# Patient Record
Sex: Female | Born: 1960 | Race: White | Hispanic: No | Smoking: Never smoker
Health system: Southern US, Community
[De-identification: ages and names within clinical notes are randomized; demographics above are authoritative.]

---

## 1998-10-02 ENCOUNTER — Other Ambulatory Visit: Admission: RE | Admit: 1998-10-02 | Discharge: 1998-10-02 | Payer: Self-pay | Admitting: *Deleted

## 2000-03-18 ENCOUNTER — Other Ambulatory Visit: Admission: RE | Admit: 2000-03-18 | Discharge: 2000-03-18 | Payer: Self-pay | Admitting: *Deleted

## 2001-03-23 ENCOUNTER — Other Ambulatory Visit: Admission: RE | Admit: 2001-03-23 | Discharge: 2001-03-23 | Payer: Self-pay | Admitting: *Deleted

## 2002-04-24 ENCOUNTER — Other Ambulatory Visit: Admission: RE | Admit: 2002-04-24 | Discharge: 2002-04-24 | Payer: Self-pay | Admitting: *Deleted

## 2004-10-09 ENCOUNTER — Ambulatory Visit: Payer: Self-pay | Admitting: Obstetrics and Gynecology

## 2005-11-19 ENCOUNTER — Ambulatory Visit: Payer: Self-pay | Admitting: Obstetrics and Gynecology

## 2007-01-05 ENCOUNTER — Ambulatory Visit: Payer: Self-pay | Admitting: Obstetrics and Gynecology

## 2007-12-27 ENCOUNTER — Emergency Department: Payer: Self-pay | Admitting: Emergency Medicine

## 2009-03-19 ENCOUNTER — Ambulatory Visit: Payer: Self-pay | Admitting: Obstetrics and Gynecology

## 2009-04-04 ENCOUNTER — Ambulatory Visit: Payer: Self-pay | Admitting: Obstetrics and Gynecology

## 2009-04-18 ENCOUNTER — Ambulatory Visit: Payer: Self-pay | Admitting: Surgery

## 2009-10-15 ENCOUNTER — Ambulatory Visit: Payer: Self-pay | Admitting: Radiation Oncology

## 2009-10-16 ENCOUNTER — Ambulatory Visit: Payer: Self-pay | Admitting: Radiation Oncology

## 2009-10-21 ENCOUNTER — Ambulatory Visit: Payer: Self-pay | Admitting: Radiation Oncology

## 2009-10-28 ENCOUNTER — Ambulatory Visit: Payer: Self-pay | Admitting: Unknown Physician Specialty

## 2009-11-15 ENCOUNTER — Ambulatory Visit: Payer: Self-pay | Admitting: Radiation Oncology

## 2009-12-16 ENCOUNTER — Ambulatory Visit: Payer: Self-pay | Admitting: Radiation Oncology

## 2009-12-20 ENCOUNTER — Ambulatory Visit: Payer: Self-pay | Admitting: Internal Medicine

## 2010-01-16 ENCOUNTER — Ambulatory Visit: Payer: Self-pay | Admitting: Radiation Oncology

## 2010-07-10 ENCOUNTER — Ambulatory Visit: Payer: Self-pay | Admitting: Radiation Oncology

## 2010-07-17 ENCOUNTER — Ambulatory Visit: Payer: Self-pay | Admitting: Radiation Oncology

## 2010-10-29 IMAGING — US ULTRASOUND RIGHT BREAST
1 series · 12 of 12 positions shown · non-contrast
Comparison: none

REASON FOR EXAM: RT BR DENSITY US
COMMENTS:

[Series 1: ultrasound right breast · 12 of 12 slices shown]
[im 1/12]
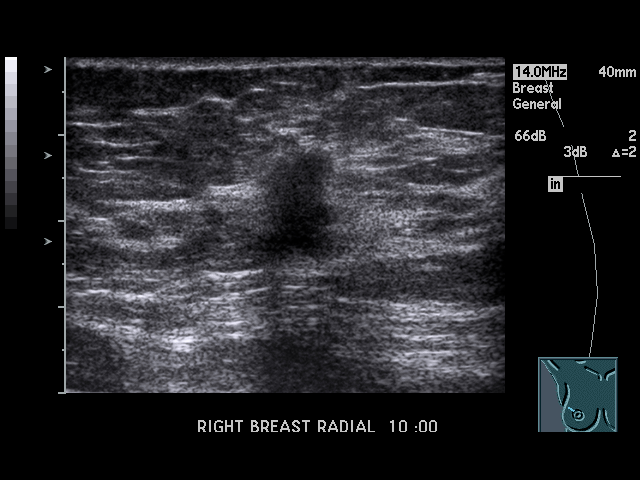
[im 2/12]
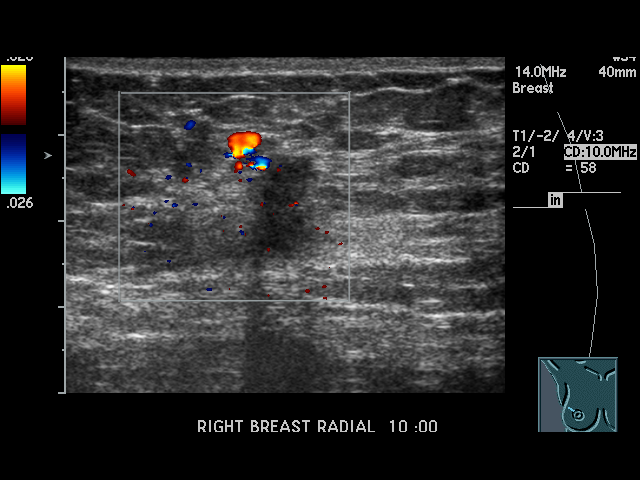
[im 3/12]
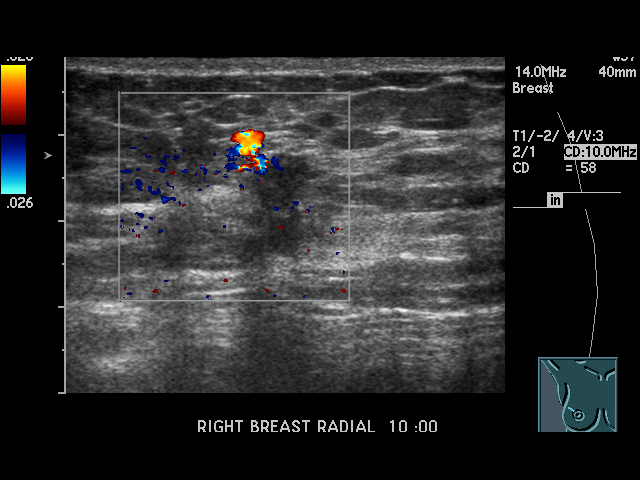
[im 4/12]
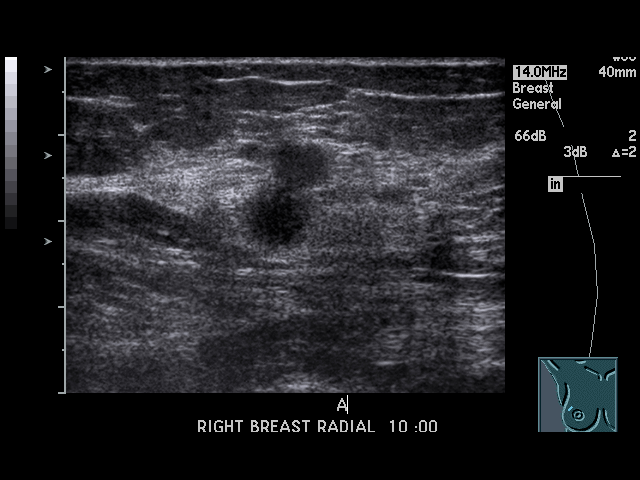
[im 5/12]
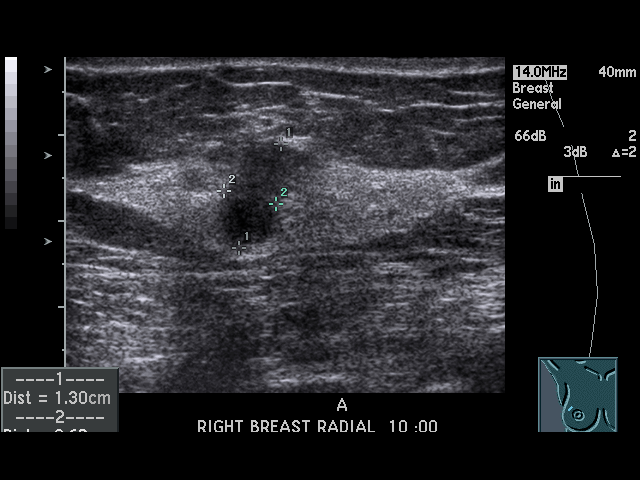
[im 6/12]
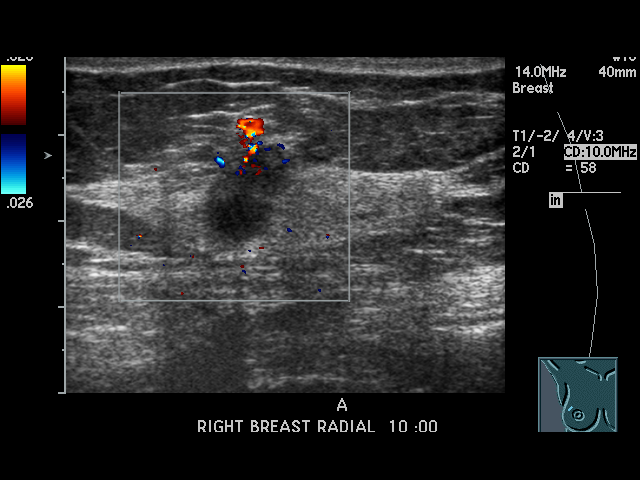
[im 7/12]
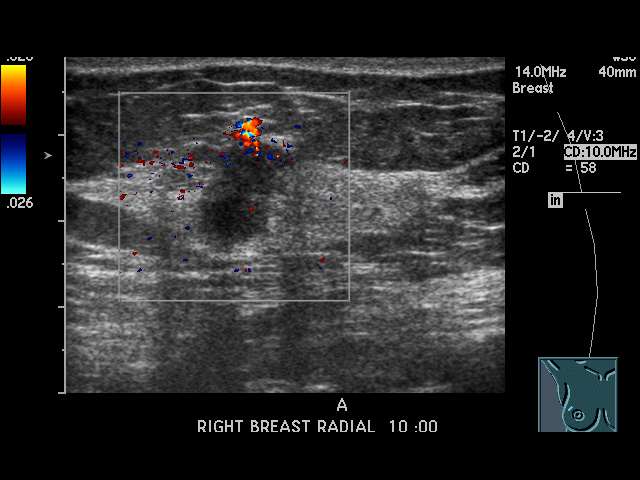
[im 8/12]
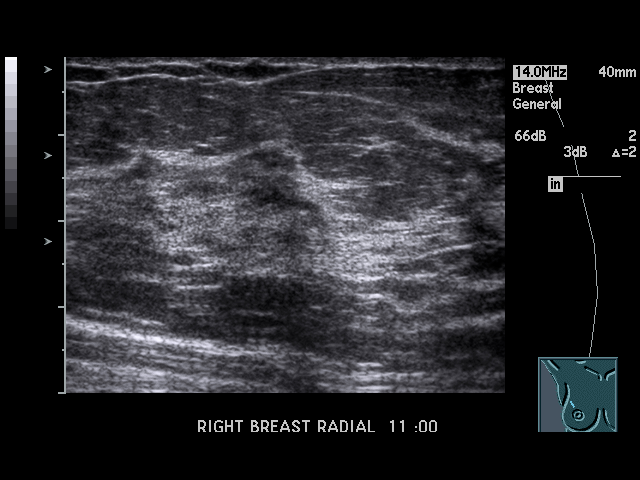
[im 9/12]
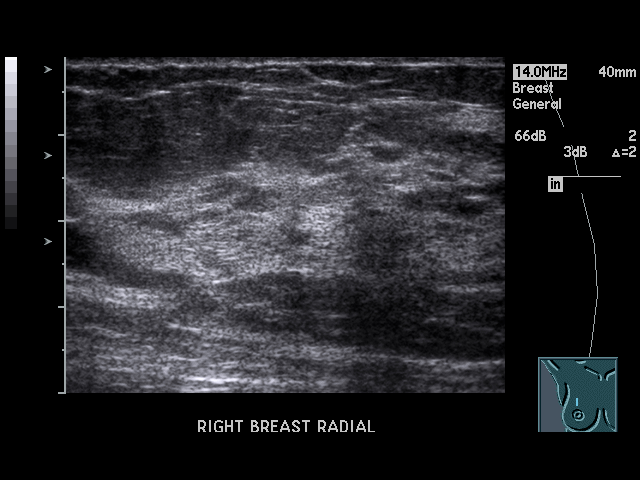
[im 10/12]
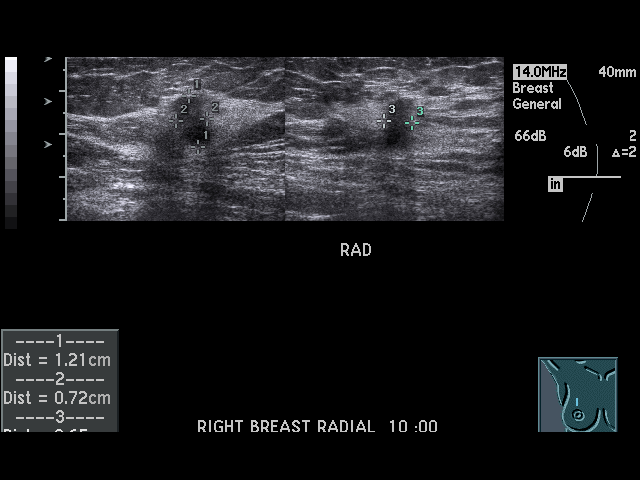
[im 11/12]
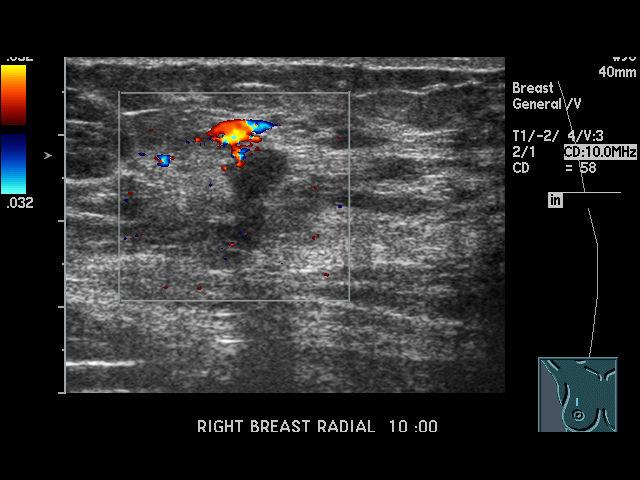
[im 12/12]
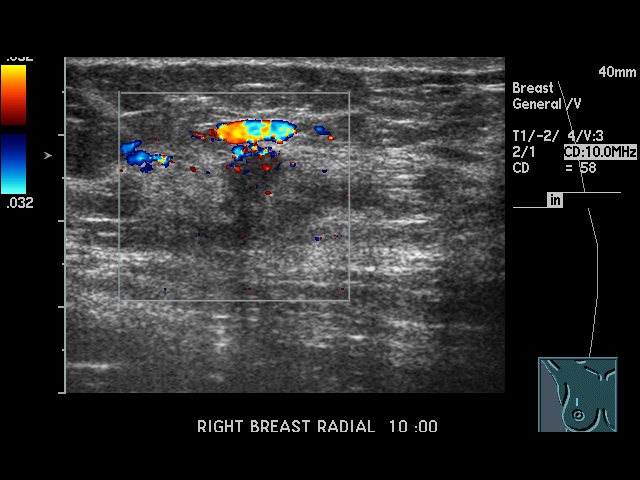

[12 of 12 positions shown; findings below may reference images not displayed]

PROCEDURE:     US  - US BREAST RIGHT  - April 04, 2009  [DATE]

RESULT:       Sonographic evaluation of the right breast demonstrates a
bilobed hypoechoic solid-appearing mass with somewhat irregular or
ill-defined margins in the upper outer posterior right breast at 10 o'clock
measuring 1.21 x 0.72 x 0.65 cm.  Surgical consultation is recommended for
consideration of biopsy.
IMPRESSION: BI-RADS:  Category 4- Suspicious Abnormality.

A negative mammogram report does not preclude biopsy or other evaluation of
a clinically palpable or otherwise suspicious mass or lesion. Breast cancer
may not be detected by mammography in up to 10% of cases.

## 2011-01-29 ENCOUNTER — Ambulatory Visit: Payer: Self-pay | Admitting: Radiation Oncology

## 2011-02-16 ENCOUNTER — Ambulatory Visit: Payer: Self-pay | Admitting: Radiation Oncology

## 2012-01-28 ENCOUNTER — Ambulatory Visit: Payer: Self-pay | Admitting: Radiation Oncology

## 2012-02-16 ENCOUNTER — Ambulatory Visit: Payer: Self-pay | Admitting: Radiation Oncology

## 2012-03-23 DIAGNOSIS — Z170 Estrogen receptor positive status [ER+]: Secondary | ICD-10-CM | POA: Insufficient documentation

## 2012-03-23 DIAGNOSIS — C50811 Malignant neoplasm of overlapping sites of right female breast: Secondary | ICD-10-CM | POA: Insufficient documentation

## 2012-06-02 ENCOUNTER — Ambulatory Visit: Payer: Self-pay | Admitting: Internal Medicine

## 2013-11-09 ENCOUNTER — Encounter: Payer: Self-pay | Admitting: Podiatrist

## 2013-11-09 ENCOUNTER — Ambulatory Visit (INDEPENDENT_AMBULATORY_CARE_PROVIDER_SITE_OTHER): Payer: BC Managed Care – PPO | Admitting: Podiatrist

## 2013-11-09 ENCOUNTER — Ambulatory Visit (INDEPENDENT_AMBULATORY_CARE_PROVIDER_SITE_OTHER): Payer: BC Managed Care – PPO

## 2013-11-09 VITALS — BP 148/77 | HR 103 | Resp 16 | Ht 63.0 in | Wt 200.0 lb

## 2013-11-09 DIAGNOSIS — M205X9 Other deformities of toe(s) (acquired), unspecified foot: Secondary | ICD-10-CM

## 2013-11-09 DIAGNOSIS — M21611 Bunion of right foot: Secondary | ICD-10-CM

## 2013-11-09 DIAGNOSIS — M722 Plantar fascial fibromatosis: Secondary | ICD-10-CM

## 2013-11-09 DIAGNOSIS — M7750 Other enthesopathy of unspecified foot: Secondary | ICD-10-CM

## 2013-11-09 DIAGNOSIS — M7751 Other enthesopathy of right foot: Secondary | ICD-10-CM

## 2013-11-09 DIAGNOSIS — M205X1 Other deformities of toe(s) (acquired), right foot: Secondary | ICD-10-CM

## 2013-11-09 DIAGNOSIS — M21619 Bunion of unspecified foot: Secondary | ICD-10-CM

## 2013-11-09 NOTE — Patient Instructions (Signed)
Plantar Fasciitis (Heel Spur Syndrome) with Rehab The plantar fascia is a fibrous, ligament-like, soft-tissue structure that spans the bottom of the foot. Plantar fasciitis is a condition that causes pain in the foot due to inflammation of the tissue. SYMPTOMS   Pain and tenderness on the underneath side of the foot.  Pain that worsens with standing or walking. CAUSES  Plantar fasciitis is caused by irritation and injury to the plantar fascia on the underneath side of the foot. Common mechanisms of injury include:  Direct trauma to bottom of the foot.  Damage to a small nerve that runs under the foot where the main fascia attaches to the heel bone. Stress placed on the plantar fascia due to any mild increased activity or injury RISK INCREASES WITH:   Obesity.  Poor strength and flexibility.  Improperly fitted shoes.  Tight calf muscles.  Flat feet.  Failure to warm-up properly before activity.  PREVENTION  Warm up and stretch properly before activity.  Strength, flexibility  Maintain a health body weight.  Avoid stress on the plantar fascia.  Wear properly fitted shoes, including arch supports for individuals who have flat feet. PROGNOSIS  If treated properly, then the symptoms of plantar fasciitis usually resolve without surgery. However, occasionally surgery is necessary. RELATED COMPLICATIONS   Recurrent symptoms that may result in a chronic condition.  Problems of the lower back that are caused by compensating for the injury, such as limping.  Pain or weakness of the foot during push-off following surgery.  Chronic inflammation, scarring, and partial or complete fascia tear, occurring more often from repeated injections. TREATMENT  Treatment initially involves the use of ice and medication to help reduce pain and inflammation. The use of strengthening and stretching exercises may help reduce pain with activity, especially stretches of the Achilles tendon.  Your  caregiver may recommend that you use arch supports to help reduce stress on the plantar fascia. Often, corticosteroid injections are given to reduce inflammation. If symptoms persist for greater than 6 months despite non-surgical (conservative), then surgery may be recommended.  MEDICATION   If pain medication is necessary, then nonsteroidal anti-inflammatory medications, such as aspirin and ibuprofen, or other minor pain relievers, such as acetaminophen, are often recommended. Corticosteroid injections may be given by your caregiver.  HEAT AND COLD  Cold treatment (icing) relieves pain and reduces inflammation. Cold treatment should be applied for 10 to 15 minutes every 2 to 3 hours for inflammation and pain and immediately after any activity that aggravates your symptoms. Use ice packs or massage the area with a piece of ice (ice massage).  Heat treatment may be used prior to performing the stretching and strengthening activities prescribed by your caregiver, physical therapist, or athletic trainer. Use a heat pack or soak the injury in warm water. SEEK IMMEDIATE MEDICAL CARE IF:  Treatment seems to offer no benefit, or the condition worsens.  Any medications produce adverse side effects.    EXERCISES-- perform each exercise a total of 10-15 repetitions.  Hold for 30 seconds and perform 3 times per day   RANGE OF MOTION (ROM) AND STRETCHING EXERCISES - Plantar Fasciitis (Heel Spur Syndrome) These exercises may help you when beginning to rehabilitate your injury.   While completing these exercises, remember:   Restoring tissue flexibility helps normal motion to return to the joints. This allows healthier, less painful movement and activity.  An effective stretch should be held for at least 30 seconds.  A stretch should never be painful. You   should only feel a gentle lengthening or release in the stretched tissue. RANGE OF MOTION - Toe Extension, Flexion  Sit with your right / left  leg crossed over your opposite knee.  Grasp your toes and gently pull them back toward the top of your foot. You should feel a stretch on the bottom of your toes and/or foot.  Hold this stretch for __________ seconds.  Now, gently pull your toes toward the bottom of your foot. You should feel a stretch on the top of your toes and or foot.  Hold this stretch for __________ seconds. Repeat __________ times. Complete this stretch __________ times per day.  RANGE OF MOTION - Ankle Dorsiflexion, Active Assisted  Remove shoes and sit on a chair that is preferably not on a carpeted surface.  Place right / left foot under knee. Extend your opposite leg for support.  Keeping your heel down, slide your right / left foot back toward the chair until you feel a stretch at your ankle or calf. If you do not feel a stretch, slide your bottom forward to the edge of the chair, while still keeping your heel down.  Hold this stretch for __________ seconds. Repeat __________ times. Complete this stretch __________ times per day.  STRETCH  Gastroc, Standing  Place hands on wall.  Extend right / left leg, keeping the front knee somewhat bent.  Slightly point your toes inward on your back foot.  Keeping your right / left heel on the floor and your knee straight, shift your weight toward the wall, not allowing your back to arch.  You should feel a gentle stretch in the right / left calf. Hold this position for __________ seconds. Repeat __________ times. Complete this stretch __________ times per day. STRETCH  Soleus, Standing  Place hands on wall.  Extend right / left leg, keeping the other knee somewhat bent.  Slightly point your toes inward on your back foot.  Keep your right / left heel on the floor, bend your back knee, and slightly shift your weight over the back leg so that you feel a gentle stretch deep in your back calf.  Hold this position for __________ seconds. Repeat __________ times.  Complete this stretch __________ times per day. STRETCH  Gastrocsoleus, Standing  Note: This exercise can place a lot of stress on your foot and ankle. Please complete this exercise only if specifically instructed by your caregiver.   Place the ball of your right / left foot on a step, keeping your other foot firmly on the same step.  Hold on to the wall or a rail for balance.  Slowly lift your other foot, allowing your body weight to press your heel down over the edge of the step.  You should feel a stretch in your right / left calf.  Hold this position for __________ seconds.  Repeat this exercise with a slight bend in your right / left knee. Repeat __________ times. Complete this stretch __________ times per day.  STRENGTHENING EXERCISES - Plantar Fasciitis (Heel Spur Syndrome)  These exercises may help you when beginning to rehabilitate your injury. They may resolve your symptoms with or without further involvement from your physician, physical therapist or athletic trainer. While completing these exercises, remember:   Muscles can gain both the endurance and the strength needed for everyday activities through controlled exercises.  Complete these exercises as instructed by your physician, physical therapist or athletic trainer. Progress the resistance and repetitions only as guided.  Hallux  Rigidus Hallux rigidus is a condition involving pain and a loss of motion of the first (big) toe. The pain gets worse with lifting up (extension) of the toe. This is usually due to arthritic bony bumps (spurring) of the joint at the base of the big toe.  SYMPTOMS   Pain, with lifting up of the toe.  Tenderness over the joint where the big toe meets the foot.  Redness, swelling, and warmth over the top of the base of the big toe (sometimes).  Foot pain, stiffness, and limping. CAUSES  Halllux rigidus is caused by arthritis of the joint where the big toe meets the foot. The arthritis creates  a bone spur that pinches the soft tissues, when the toe is extended. RISK INCREASES WITH:  Tight shoes, with a narrow toe box.  Family history of foot problems.  Gout and rheumatoid and psoriatic arthritis.  History of previous toe injury, including "turf toe."  Long first toe, flat feet, and other big toe bony bumps.  Arthritis of the big toe. PREVENTION   Wear wide toed shoes that fit well.  Tape the big toe, to reduce motion and to prevent pinching of the tissues between the bone.  Maintain physical fitness:  Foot and ankle flexibility.  Muscle strength and endurance. PROGNOSIS  This condition can usually be managed with proper treatment. However, surgery is typically required to prevent the problem from recurring.  RELATED COMPLICATIONS  Injury to other areas of the foot or ankle, caused by abnormal walking in an attempt to avoid the pain felt when walking normally. TREATMENT Treatment first involves stopping the activities that aggravate your symptoms. Ice and medicine can be used to reduce the pain and inflammation. Modifications to shoes may help reduce pain, including wearing stiff-soled shoes, shoes with a wide toe box, inserting a padded donut to relieve pressure on top of the joint, or wearing an arch support. Corticosteroid injections may be given to reduce inflammation. If non-surgical treatment is unsuccessful, surgery may be needed. Surgical options include removing the arthritic bony spur, cutting a bone in the foot to change the arc of motion (allowing the toe to extend more), or fusion of the joint (eliminating all motion in the joint at the base of the big toe).  MEDICATION   If pain medicine is needed, nonsteroidal anti-inflammatory medicines (aspirin and ibuprofen), or other minor pain relievers (acetaminophen), are often advised.  Do not take pain medicine for 7 days before surgery.  Prescription pain relievers are usually prescribed only after surgery. Use  only as directed and only as much as you need.  Ointments for arthritis, applied to the skin, may give some relief.  Injections of corticosteroids may be given to reduce inflammation. HEAT AND COLD  Cold treatment (icing) relieves pain and reduces inflammation. Cold treatment should be applied for 10 to 15 minutes every 2 to 3 hours, and immediately after activity that aggravates your symptoms. Use ice packs or an ice massage.  Heat treatment may be used before performing the stretching and strengthening activities prescribed by your caregiver, physical therapist, or athletic trainer. Use a heat pack or a warm water soak. SEEK MEDICAL CARE IF:   Symptoms get worse or do not improve in 2 weeks, despite treatment.  After surgery you develop fever, increasing pain, redness, swelling, drainage of fluids, bleeding, or increasing warmth.  New, unexplained symptoms develop. (Drugs used in treatment may produce side effects.) Document Released: 05/04/2005 Document Revised: 07/27/2011 Document Reviewed: 08/16/2008 [employer] Patient  Information 2015 [employer]. This information is not intended to replace advice given to you by your health care provider. Make sure you discuss any questions you have with your health care provider.  

## 2013-11-09 NOTE — Progress Notes (Signed)
   Subjective:    Patient ID: Nicole Romero, female    DOB: Oct 03, 1960, 53 y.o.   MRN: [id number]  HPI Comments: i have pain in the right foot for 1 year. It hurts on top and in the heel. Its gotten worse. Tennis shoes hurt. i havent done anything for my foot.  Foot Pain      Review of Systems  All other systems reviewed and are negative.      Objective:   Physical Exam Patient is awake, alert, and oriented x 3.  In no acute distress.  Vascular status is intact with palpable pedal pulses at 2/4 DP and PT bilateral and capillary refill time within normal limits. Neurological sensation is also intact bilaterally via Semmes Weinstein monofilament at 5/5 sites. Light touch, vibratory sensation, Achilles tendon reflex is intact. Dermatological exam reveals skin color, turger and texture as normal. No open lesions present.  Musculature intact with dorsiflexion, plantarflexion, inversion, eversion.  Decrease in range of motion at the first metatarsophalangeal joint right is noted. Mild pain on palpation plantar medial aspect of the right heel is noted. X-ray show hallux limitus with dorsal spurring seen at the first metatarsal head..    Assessment & Plan:  Hallux limitus, rigidus, plantar fasciitis right   Plan: Recommended an injection into the hallux limitus and the patient agreed I prepped the skin with alcohol and infiltrated dexamethasone and Marcaine mixture under sterile technique. Also recommended orthotics to help with both her problems and she was scanned at today's visit. An orthotic with a soft extension at the first metatarsal will be made for her.

## 2013-11-22 ENCOUNTER — Encounter: Payer: Self-pay | Admitting: *Deleted

## 2013-11-22 NOTE — Progress Notes (Signed)
Called pt letting her know orthotics were here. Pt made appt [phone].

## 2013-11-30 ENCOUNTER — Ambulatory Visit: Payer: Self-pay | Admitting: Podiatrist

## 2013-12-27 IMAGING — CT CT CHEST W/ CM
1 series · 15 of 33 positions shown, 19 images · IV contrast (APPLIED)
Comparison: none

REASON FOR EXAM: Call Report  [id number]  Dr Celly Beckmann patient  dyspnea
Eval  for Pulmonary E...
COMMENTS:

[Series 5: soft tissue · axial · 0.76mm/px · z∈[-328,-70]mm · 15 of 102 slices shown, 19 images]
[im 8/102  mediastinal]
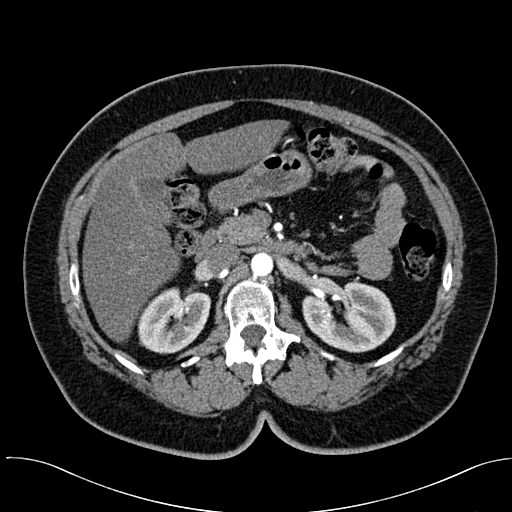
[im 8/102  lung]
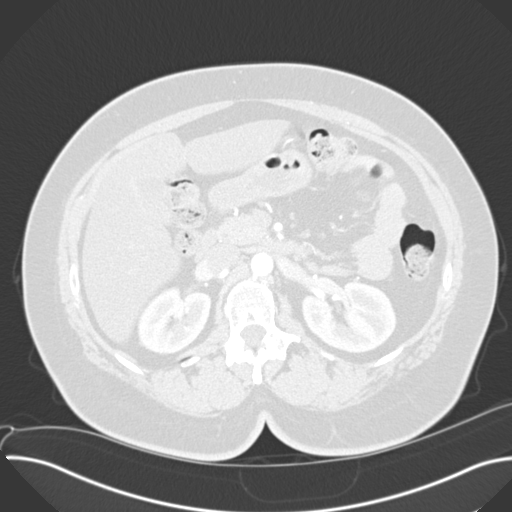
[im 15/102  lung]
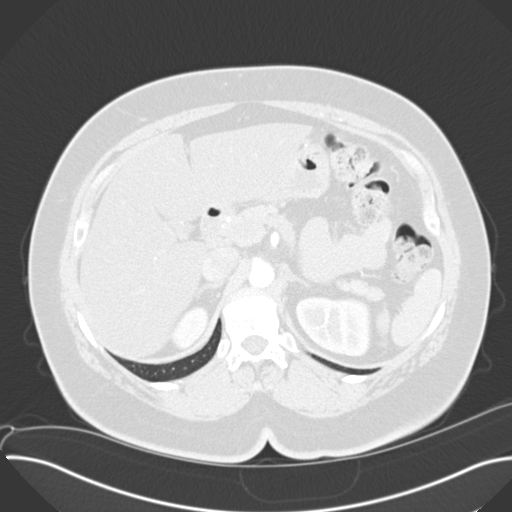
[im 21/102  lung]
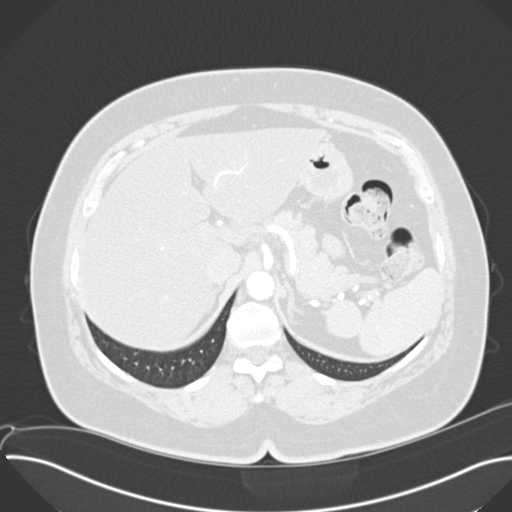
[im 27/102  lung]
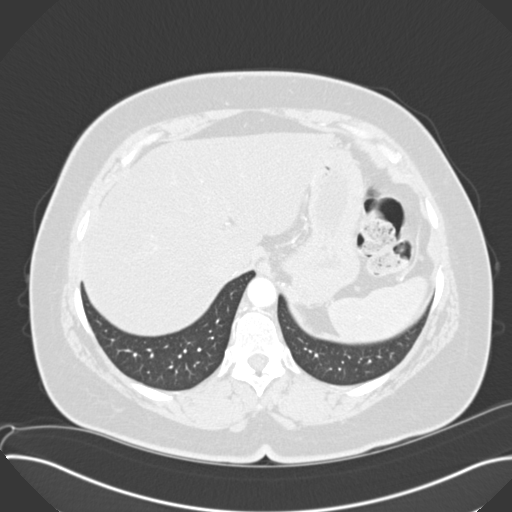
[im 34/102  mediastinal]
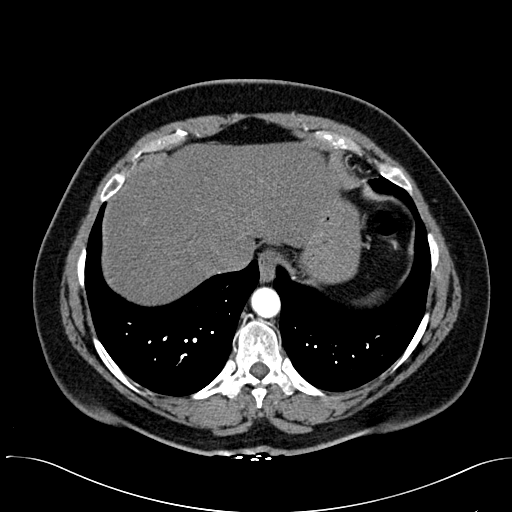
[im 34/102  lung]
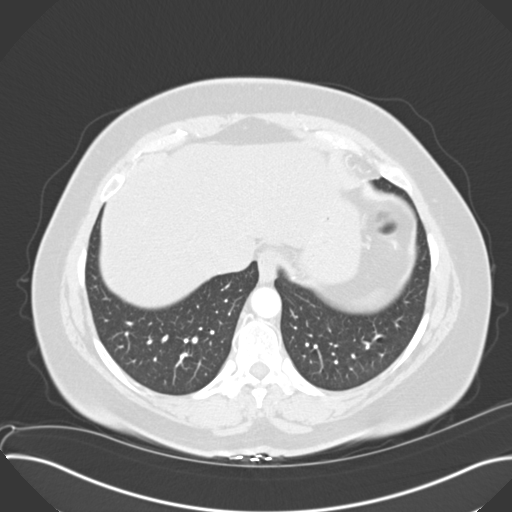
[im 41/102  lung]
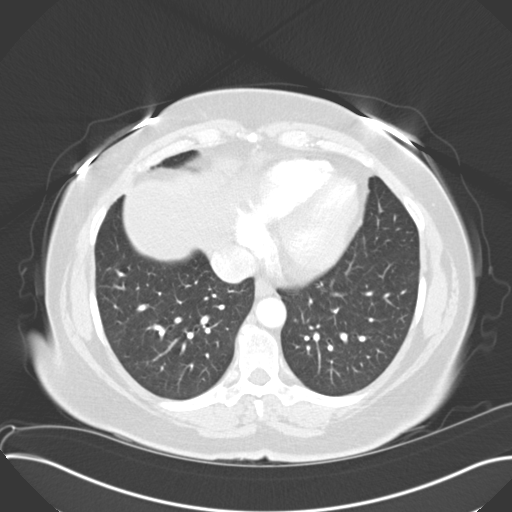
[im 45/102  lung]
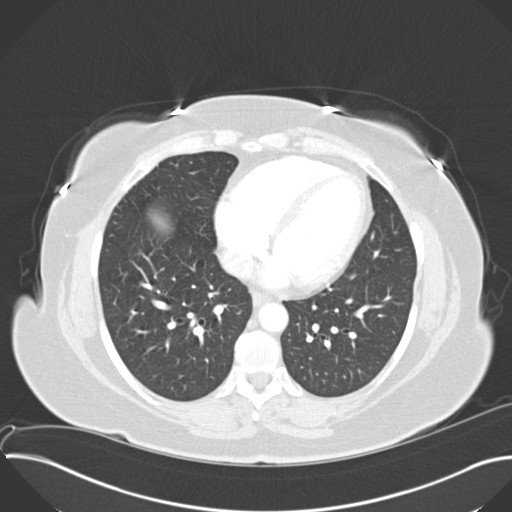
[im 53/102  lung]
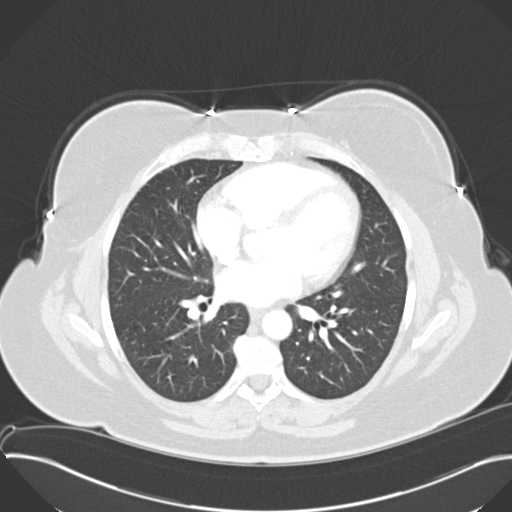
[im 57/102  mediastinal]
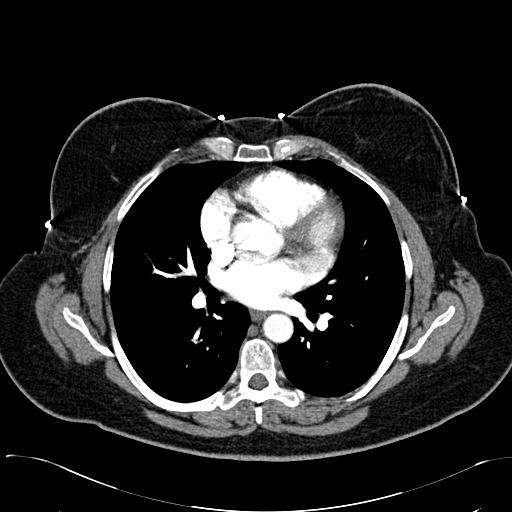
[im 57/102  lung]
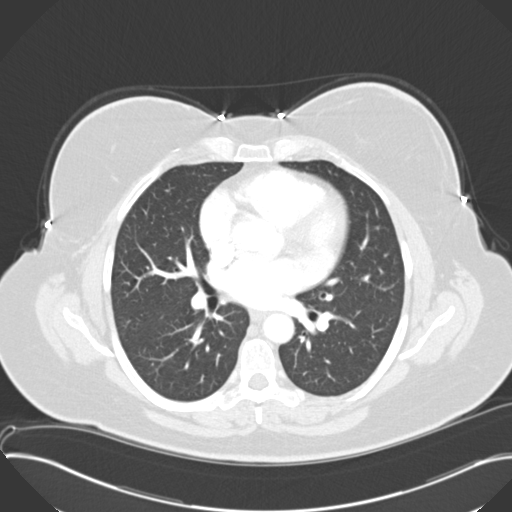
[im 61/102  lung]
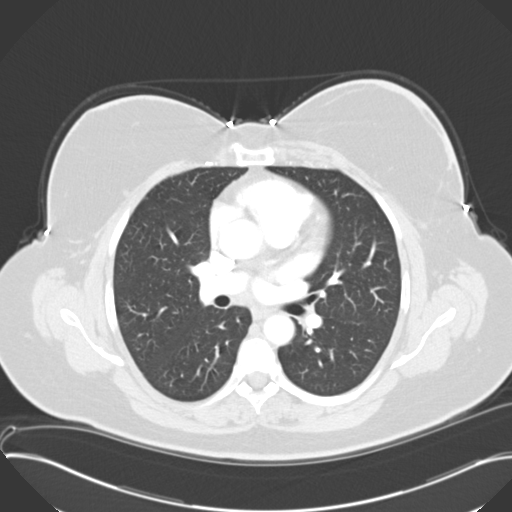
[im 68/102  lung]
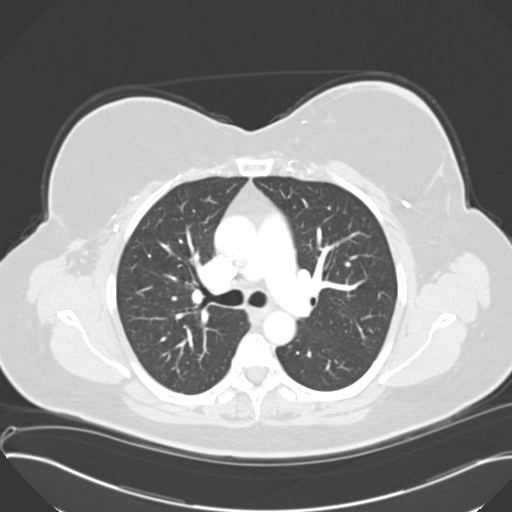
[im 75/102  lung]
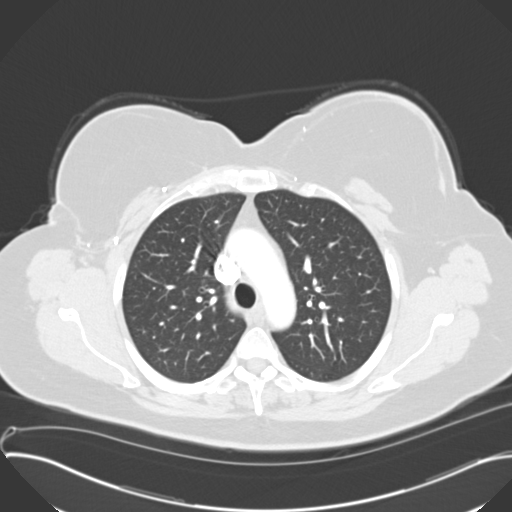
[im 81/102  mediastinal]
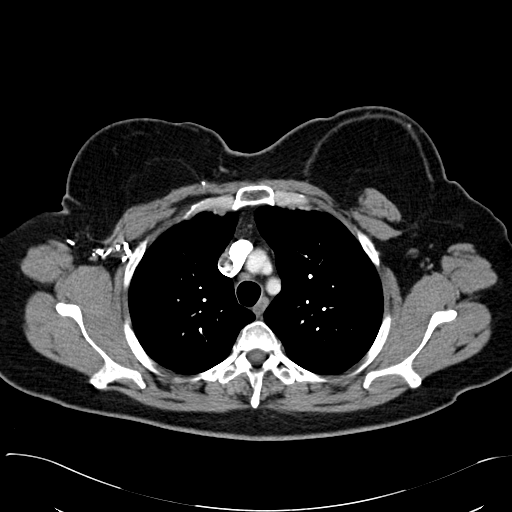
[im 81/102  lung]
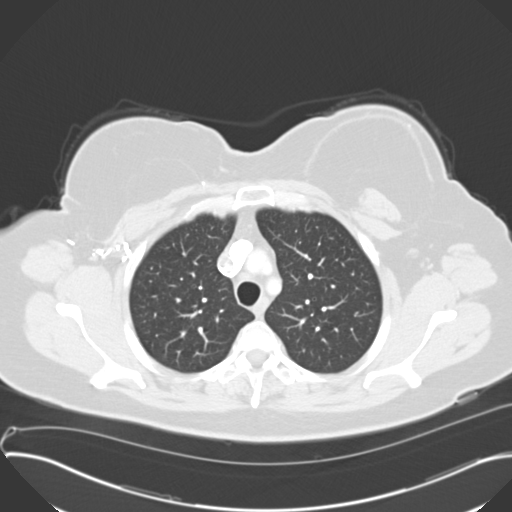
[im 87/102  lung]
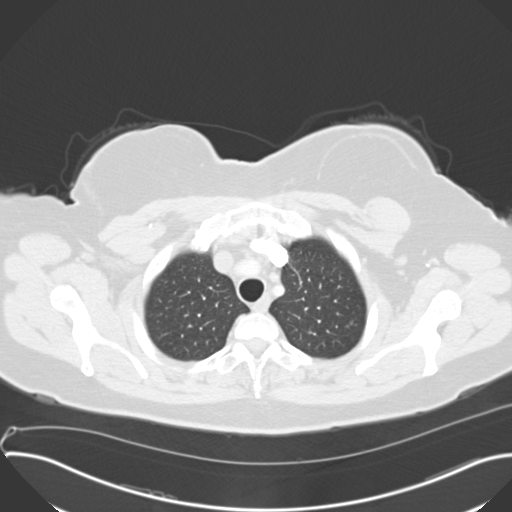
[im 94/102  lung]
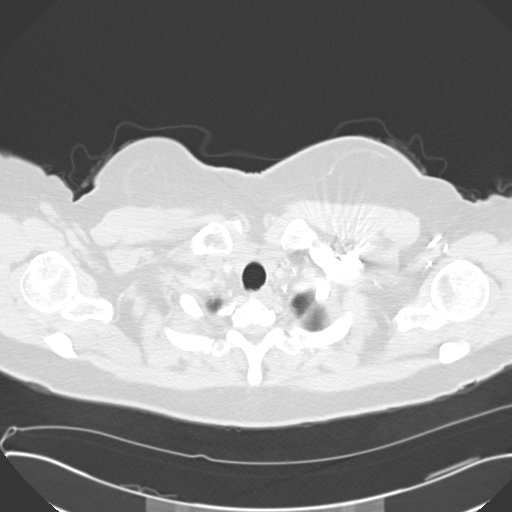

[15 of 33 positions shown; findings below may reference images not displayed]

PROCEDURE:     CT  - CT CHEST (FOR PE) W  - June 02, 2012  [DATE]

RESULT:     Axial CT scanning was performed through the chest with
reconstructions at 3 mm intervals and slice thicknesses. Review of
multiplanar reconstructed images was performed separately on the VIA
monitor. The patient received 100 cc of [id number]-[id number] intravenously.

Contrast within the pulmonary arterial tree is normal in appearance. There
are no filling defects to suggest an acute pulmonary embolism. The cardiac
chambers are top normal in size. The caliber of the thoracic aorta is normal
and there is no evidence of a false lumen. The thoracic esophagus is normal
in caliber. There is no pleural nor pericardial effusion. There are no
pathologic sized mediastinal or hilar lymph nodes. There is no pleural nor
pericardial effusion.

At lung window settings there is no interstitial nor alveolar infiltrate.
There are no pulmonary parenchymal masses. Within the upper abdomen there is
decreased density in the liver consistent with fatty infiltration. There are
no adrenal masses. The observed portions of the gallbladder appear normal.
There is no splenomegaly. The stomach is only partially distended.
IMPRESSION: 1. There is no evidence of an acute pulmonary embolism.
2. There is no evidence of acute thoracic aortic pathology nor evidence of
CHF.
3. There is no evidence of pneumonia. There is no pneumothorax.

A preliminary report was called to Dr. Alahassan at approximately [DATE] p.m. on
02 June, 2012.

[REDACTED]

## 2014-01-25 DIAGNOSIS — Z1379 Encounter for other screening for genetic and chromosomal anomalies: Secondary | ICD-10-CM | POA: Insufficient documentation

## 2014-03-01 ENCOUNTER — Ambulatory Visit: Payer: Self-pay | Admitting: Obstetrics & Gynecology

## 2014-03-01 LAB — CBC
HCT: 39 % (ref 35.0–47.0)
HGB: 12.5 g/dL (ref 12.0–16.0)
MCH: 29.9 pg (ref 26.0–34.0)
MCHC: 32 g/dL (ref 32.0–36.0)
MCV: 93 fL (ref 80–100)
Platelet: 238 10*3/uL (ref 150–440)
RBC: 4.18 10*6/uL (ref 3.80–5.20)
RDW: 12.7 % (ref 11.5–14.5)
WBC: 6.8 10*3/uL (ref 3.6–11.0)

## 2014-03-01 LAB — APTT: Activated PTT: 28.1 secs (ref 23.6–35.9)

## 2014-03-01 LAB — PROTIME-INR
INR: 0.9
Prothrombin Time: 12.3 secs (ref 11.5–14.7)

## 2014-03-08 ENCOUNTER — Ambulatory Visit: Payer: Self-pay | Admitting: Obstetrics & Gynecology

## 2014-06-21 DIAGNOSIS — K2100 Gastro-esophageal reflux disease with esophagitis, without bleeding: Secondary | ICD-10-CM | POA: Insufficient documentation

## 2014-06-21 DIAGNOSIS — G2581 Restless legs syndrome: Secondary | ICD-10-CM | POA: Insufficient documentation

## 2014-07-20 ENCOUNTER — Ambulatory Visit: Payer: Self-pay | Admitting: Unknown Physician Specialty

## 2014-09-08 NOTE — Op Note (Signed)
PATIENT NAME:  Nicole Romero, Nicole Romero MR#:  [id number] DATE OF BIRTH:  1960-07-14  DATE OF PROCEDURE:  03/08/2014  PREOPERATIVE DIAGNOSIS: Breast cancer and risk for recurrence of breast cancer and ovarian cancer.   POSTOPERATIVE DIAGNOSIS: Breast cancer and risk for recurrence of breast cancer and ovarian cancer.   PROCEDURE: Operative laparoscopy with prophylactic bilateral salpingo-oophorectomy.   SURGEON: Glean Salen, MD   ANESTHESIA: General.   ESTIMATED BLOOD LOSS: Minimal.   COMPLICATIONS: None.   FINDINGS: Normal tubes, ovaries, and uterus visualized. Minimal adhesions.   DISPOSITION: To the recovery room in stable condition.   TECHNIQUE: The patient is prepped and draped in the usual sterile fashion after adequate anesthesia is obtained in the dorsal lithotomy position. A Foley catheter is inserted. A Hulka tenaculum is placed on the cervix for manipulation purposes.   Attention is then turned to the abdomen where a Veress needle is inserted through a 5 mm incision in the left upper quadrant at [place] point. Placement of the needle was confirmed using the hanging drop technique and the abdomen is then insufflated with CO2 gas. A 5 mm trocar is then inserted under direct visualization with the laparoscope with no injuries or bleeding noted. The patient is placed in Trendelenburg position and the above-mentioned findings are visualized.   An 11 mm trocar is placed in the right lower quadrant and a 5 mm trocar is placed in the suprapubic region under direct visualization with the laparoscope with no injuries or bleeding noted. Using the 5 mm Harmonic scalpel, the left adnexa is identified and coagulated and cut for full dissection free across the uterine ovarian blood vessels and the ovarian blood vessels. Once it is completely amputated, it is placed in the posterior cul-de-sac for later retrieval. Hemostasis is assured using electrocautery. The right fallopian tube and its ovary are also  grasped and carefully coagulated and dissected free using the Harmonic scalpel with careful coagulation of the pedicles involving the ovarian artery as well as the uterine ovarian blood vessels, and once it is completely dissected free it is placed in the posterior cul-de-sac as well. Examination of both operative sites reveals excellent hemostasis. An Endopouch is placed and both vessels are placed into the Endopouch and removed from the abdominal cavity. The pelvic cavity is irrigated with copious amounts of fluid with aspiration of all fluid and excellent hemostasis noted. There is no apparent injury to bowel, bladder, ureter or other structures. The patient is leveled. Gas is expelled. Trocars are removed. Skin is closed with Dermabond as well as a 4-0 Vicryl suture in the right lower quadrant skin incision. Hulka tenaculum is removed and patient goes to the recovery room in stable condition. All sponge, instrument, and needle counts are correct at the conclusion of the case.    ____________________________ R. Barnett Applebaum, MD rph:ST D: 03/08/2014 12:46:16 ET T: 03/09/2014 01:00:57 ET JOB#: [id number]  cc: Glean Salen, MD, <Dictator> Gae Dry MD ELECTRONICALLY SIGNED 03/09/2014 7:24

## 2014-09-10 LAB — SURGICAL PATHOLOGY

## 2017-02-24 DIAGNOSIS — R7989 Other specified abnormal findings of blood chemistry: Secondary | ICD-10-CM | POA: Insufficient documentation

## 2018-05-20 ENCOUNTER — Ambulatory Visit (INDEPENDENT_AMBULATORY_CARE_PROVIDER_SITE_OTHER): Payer: BC Managed Care – PPO | Admitting: Podiatry

## 2018-05-20 ENCOUNTER — Ambulatory Visit (INDEPENDENT_AMBULATORY_CARE_PROVIDER_SITE_OTHER): Payer: BC Managed Care – PPO

## 2018-05-20 ENCOUNTER — Encounter: Payer: Self-pay | Admitting: Podiatry

## 2018-05-20 VITALS — BP 156/73 | HR 67 | Temp 97.8°F

## 2018-05-20 DIAGNOSIS — M205X1 Other deformities of toe(s) (acquired), right foot: Secondary | ICD-10-CM

## 2018-05-20 NOTE — Progress Notes (Signed)
   HPI: 58 year old female presenting today with a chief complaint of intermittent throbbing, aching and burning pain of the right hallux that began a few years ago. Bending the toe and wearing certain shoes increases the pain. She has been taking Tylenol and Ibuprofen with no significant relief. Patient is here for further evaluation and treatment.   No past medical history on file.   Physical Exam: General: The patient is alert and oriented x3 in no acute distress.  Dermatology: Skin is warm, dry and supple bilateral lower extremities. Negative for open lesions or macerations.  Vascular: Palpable pedal pulses bilaterally. No edema or erythema noted. Capillary refill within normal limits.  Neurological: Epicritic and protective threshold grossly intact bilaterally.   Musculoskeletal Exam: Pain on palpation with limited range of motion noted to the first MPJ right foot.  Radiographic Exam: Degenerative changes noted with joint space narrowing first MPJ. There also appears to be extra-articular spurring noted about the joint.   Assessment: 1. 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 Patient evaluated. X-Rays reviewed.  2. Injection of 0.5 mLs Celestone Soluspan injected into the 1st MPJ of the right foot.  3. Recommended good shoe gear.  4. Continue taking OTC Tylenol as needed.  5. Patient thinking about surgery in the late summer after her trip to [place].  6. Return to clinic as needed.   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       Edrick Kins, DPM [facility]  Dr. Edrick Kins, DPM    [address], [place] [address]                Office [phone]  Fax [phone]

## 2018-11-08 ENCOUNTER — Encounter: Payer: Self-pay | Admitting: Podiatry

## 2018-11-08 ENCOUNTER — Ambulatory Visit: Payer: BC Managed Care – PPO | Admitting: Podiatry

## 2018-11-08 ENCOUNTER — Other Ambulatory Visit: Payer: Self-pay

## 2018-11-08 VITALS — Temp 97.5°F

## 2018-11-08 DIAGNOSIS — M205X1 Other deformities of toe(s) (acquired), right foot: Secondary | ICD-10-CM | POA: Diagnosis not present

## 2018-11-08 MED ORDER — MELOXICAM 15 MG PO TABS: 15.0000 mg | ORAL_TABLET | Freq: Every day | ORAL | 1 refills | Status: DC

## 2018-11-10 NOTE — Progress Notes (Signed)
   HPI: 58 year old female presenting today for follow up evaluation of right great toe pain. She states she is starting to experience pain in the 1st MPJ again. The last injection she received at her last visit provided relief so she is requesting another. She has been taking OTC Tylenol for treatment as well. Trying to move the toe and walking increases the pain. Patient is here for further evaluation and treatment.   History reviewed. No pertinent past medical history.   Physical Exam: General: The patient is alert and oriented x3 in no acute distress.  Dermatology: Skin is warm, dry and supple bilateral lower extremities. Negative for open lesions or macerations.  Vascular: Palpable pedal pulses bilaterally. No edema or erythema noted. Capillary refill within normal limits.  Neurological: Epicritic and protective threshold grossly intact bilaterally.   Musculoskeletal Exam: Pain on palpation with limited range of motion noted to the first MPJ right foot.  Assessment: 1. 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 Patient evaluated.   2. Injection of 0.5 mLs Celestone Soluspan injected into the 1st MPJ of the right foot.  3. Prescription for Meloxicam provided to patient. 4. Recommended stiff sole shoes.  5. Return to clinic as needed.   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       Edrick Kins, DPM [facility]  Dr. Edrick Kins, DPM    [address], [place] [address]                Office [phone]  Fax [phone]

## 2019-03-04 ENCOUNTER — Other Ambulatory Visit: Payer: Self-pay | Admitting: Podiatry

## 2019-04-18 ENCOUNTER — Other Ambulatory Visit: Payer: Self-pay | Admitting: Podiatry

## 2019-04-18 ENCOUNTER — Ambulatory Visit: Payer: BC Managed Care – PPO | Admitting: Podiatry

## 2019-04-18 ENCOUNTER — Other Ambulatory Visit: Payer: Self-pay

## 2019-04-18 DIAGNOSIS — M2021 Hallux rigidus, right foot: Secondary | ICD-10-CM | POA: Diagnosis not present

## 2019-04-18 DIAGNOSIS — M79671 Pain in right foot: Secondary | ICD-10-CM

## 2019-04-18 DIAGNOSIS — M19071 Primary osteoarthritis, right ankle and foot: Secondary | ICD-10-CM

## 2019-04-20 ENCOUNTER — Encounter: Payer: Self-pay | Admitting: Podiatry

## 2019-04-20 NOTE — Progress Notes (Signed)
Subjective:  Patient ID: Nicole Romero, female    DOB: [birth year],  MRN: [id number]  Chief Complaint  Patient presents with  . hallux limitus    Rt hallux joint flare up pain x 3 mo; 6/10 sharp pains  -worse with bending or cold wearther Tx; meloxicam -pt states," pain has been off the hook soemtimes, been up on my feet a lot."     58 y.o. female presents with the above complaint.  Patient presents with the right hallux first MPJ arthritis.  She states that the joint has flared up again.  It has been going on for 3 months now starting to get worse.  She states is worse when bending the toe or cold weather.  She has tried meloxicam which has not helped patient states that pain has been off the hook sometimes especially if she has been on her feet for a while.  She states the pain is 6 out of 10.  She states that she would like another injection as that gives her a pretty good amount of pain relief.  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   Review of Systems: Negative except as noted in the HPI. Denies N/V/F/Ch.  No past medical history on file.  Current Outpatient Medications:  .  Cholecalciferol (VITAMIN D3) 125 MCG (5000 UT) TABS, Take by mouth., Disp: , Rfl:  .  Ivermectin 1 % CREA, Apply topically., Disp: , Rfl:  .  magnesium oxide (MAG-OX) 400 MG tablet, Take by mouth., Disp: , Rfl:  .  meloxicam (MOBIC) 15 MG tablet, TAKE 1 TABLET(15 MG) BY MOUTH DAILY, Disp: 60 tablet, Rfl: 1 .  omeprazole (PRILOSEC) 20 MG capsule, Take by mouth., Disp: , Rfl:  .  pramipexole (MIRAPEX) 0.5 MG tablet, Take by mouth., Disp: , Rfl:  .  tamoxifen (NOLVADEX) 20 MG tablet, TAKE 1 TABLET BY MOUTH EVERY DAY, Disp: , Rfl:   Social History   Tobacco Use  Smoking Status Never Smoker  Smokeless Tobacco Never Used    Allergies  Allergen Reactions  . Other Itching   Objective:  There were no vitals filed for this visit. There is no height or weight on  file to calculate BMI. Constitutional Well developed. Well nourished.  Vascular Dorsalis pedis pulses palpable bilaterally. Posterior tibial pulses palpable bilaterally. Capillary refill normal to all digits.  No cyanosis or clubbing noted. Pedal hair growth normal.  Neurologic Normal speech. Oriented to person, place, and time. Epicritic sensation to light touch grossly present bilaterally.  Dermatologic Nails well groomed and normal in appearance. No open wounds. No skin lesions.  Orthopedic:  Pain on palpation to the right first metatarsophalangeal joint.  Pain with range of motion of the first metatarsophalangeal joint.  Limited range of motion active and passive of the first metatarsophalangeal joint.  There is crepitus PET present.  Less than 10 degrees of dorsiflexion present.  No pain at the IPJ of the right hallux.   Radiographs: None Assessment:  No diagnosis found. Plan:  Patient was evaluated and treated and all questions answered.  Right first metatarsal phalangeal joint no joint disease/hallux rigidus -I explained to the patient the etiology and various treatment options associated for hallux rigidus/DJD.  I explained to her that given the amount of arthritis in the future she may need fusion versus arthroplasty of the left first metatarsophalangeal joint.  She states that she understands that but for now she would like to  try the injections especially because it is giving her relief.  I agree with the patient I believe since the injections are helpful she will benefit from steroid injection. -A steroid injection was performed at right first metatarsophalangeal joint using 1% plain Lidocaine and 10 mg of Kenalog. This was well tolerated.   No follow-ups on file.

## 2019-07-02 ENCOUNTER — Other Ambulatory Visit: Payer: Self-pay | Admitting: Podiatry

## 2019-07-03 NOTE — Telephone Encounter (Signed)
Refill for Meloxicam sent [facility] in [place]

## 2019-07-15 ENCOUNTER — Ambulatory Visit: Payer: BC Managed Care – PPO | Attending: Internal Medicine

## 2019-07-15 DIAGNOSIS — Z23 Encounter for immunization: Secondary | ICD-10-CM | POA: Insufficient documentation

## 2019-07-15 NOTE — Progress Notes (Signed)
   Covid-19 Vaccination Clinic  Name:  Nicole Romero    MRN: [id number] DOB: 03-28-1961  07/15/2019  [name] was observed post Covid-19 immunization for 15 minutes without incidence. She was provided with Vaccine Information Sheet and instruction to access the V-Safe system.   Ms. Writer was instructed to call 911 with any severe reactions post vaccine: Marland Kitchen Difficulty breathing  . Swelling of your face and throat  . A fast heartbeat  . A bad rash all over your body  . Dizziness and weakness    Immunizations Administered    Name Date Dose VIS Date Route   Moderna COVID-19 Vaccine 07/15/2019  1:28 PM 0.5 mL 04/18/2019 Intramuscular   Manufacturer: Moderna   Lot: [id number]   [id number]

## 2019-08-12 ENCOUNTER — Ambulatory Visit: Payer: BC Managed Care – PPO | Attending: Internal Medicine

## 2019-08-12 DIAGNOSIS — Z23 Encounter for immunization: Secondary | ICD-10-CM

## 2019-08-12 NOTE — Progress Notes (Signed)
   Covid-19 Vaccination Clinic  Name:  Nicole Romero    MRN: [id number] DOB: 02-15-1961  08/12/2019  [name] was observed post Covid-19 immunization for 15 minutes without incident. She was provided with Vaccine Information Sheet and instruction to access the V-Safe system.   [name] was instructed to call 911 with any severe reactions post vaccine: Marland Kitchen Difficulty breathing  . Swelling of face and throat  . A fast heartbeat  . A bad rash all over body  . Dizziness and weakness   Immunizations Administered    Name Date Dose VIS Date Route   Moderna COVID-19 Vaccine 08/12/2019 10:00 AM 0.5 mL 04/18/2019 Intramuscular   Manufacturer: Moderna   Lot: [id number]   [place]:[id number]

## 2019-11-04 ENCOUNTER — Other Ambulatory Visit: Payer: Self-pay | Admitting: Podiatry

## 2019-11-07 ENCOUNTER — Ambulatory Visit (INDEPENDENT_AMBULATORY_CARE_PROVIDER_SITE_OTHER): Payer: BC Managed Care – PPO | Admitting: Podiatry

## 2019-11-07 ENCOUNTER — Other Ambulatory Visit: Payer: Self-pay

## 2019-11-07 DIAGNOSIS — M19071 Primary osteoarthritis, right ankle and foot: Secondary | ICD-10-CM

## 2019-11-07 DIAGNOSIS — Q666 Other congenital valgus deformities of feet: Secondary | ICD-10-CM | POA: Diagnosis not present

## 2019-11-07 DIAGNOSIS — M19072 Primary osteoarthritis, left ankle and foot: Secondary | ICD-10-CM

## 2019-11-08 NOTE — Progress Notes (Signed)
Subjective:  Patient ID: Nicole Romero, female    DOB: [birth year],  MRN: [id number]  Chief Complaint  Patient presents with  . Foot Pain    pt is here for a possible 6 week f/u of the right foot, pt states that the pain has recently flaied up, and is looking to get a possible injection in both feet.    59 y.o. female presents with the above complaint.  Patient presents with follow-up of right hallux MPJ arthritis.  She states the injection does help a lot.  She would like another injection.  She states that the left side started to flare back up again as well.  She would like an injection and that before it gets progressively worse.  She denies any other acute complaints.  She would also like to discuss orthotics as well.   Review of Systems: Negative except as noted in the HPI. Denies N/V/F/Ch.  No past medical history on file.  Current Outpatient Medications:  .  Calcium Carbonate-Vitamin D 600-400 MG-UNIT tablet, Take by mouth., Disp: , Rfl:  .  Cholecalciferol (VITAMIN D3) 125 MCG (5000 UT) TABS, Take by mouth., Disp: , Rfl:  .  Ivermectin 1 % CREA, Apply topically., Disp: , Rfl:  .  magnesium oxide (MAG-OX) 400 MG tablet, Take by mouth., Disp: , Rfl:  .  meloxicam (MOBIC) 15 MG tablet, TAKE 1 TABLET(15 MG) BY MOUTH DAILY, Disp: 60 tablet, Rfl: 1 .  omeprazole (PRILOSEC) 20 MG capsule, Take by mouth., Disp: , Rfl:  .  pramipexole (MIRAPEX) 0.5 MG tablet, Take by mouth., Disp: , Rfl:  .  tamoxifen (NOLVADEX) 20 MG tablet, TAKE 1 TABLET BY MOUTH EVERY DAY, Disp: , Rfl:   Social History   Tobacco Use  Smoking Status Never Smoker  Smokeless Tobacco Never Used    Allergies  Allergen Reactions  . Other Itching   Objective:  There were no vitals filed for this visit. There is no height or weight on file to calculate BMI. Constitutional Well developed. Well nourished.  Vascular Dorsalis pedis pulses palpable bilaterally. Posterior tibial pulses palpable bilaterally. Capillary  refill normal to all digits.  No cyanosis or clubbing noted. Pedal hair growth normal.  Neurologic Normal speech. Oriented to person, place, and time. Epicritic sensation to light touch grossly present bilaterally.  Dermatologic Nails well groomed and normal in appearance. No open wounds. No skin lesions.  Orthopedic:  Pain on palpation to the right first metatarsophalangeal joint.  Pain with range of motion of the first metatarsophalangeal joint.  Limited range of motion active and passive of the first metatarsophalangeal joint.  There is crepitus PET present.  Less than 10 degrees of dorsiflexion present.  No pain at the IPJ of the right hallux.  Pain on palpation to the left first metatarsophalangeal joint.  Pain with range of motion of the first metatarsophalangeal joint.  Limited range of motion active and passive of the first metatarsophalangeal joint.  There is crepitus PET present.  Less than 15 degrees of dorsiflexion present.  No pain at the IPJ of the right hallux.   Radiographs: None Assessment:   1. Osteoarthritis of first metatarsophalangeal (MTP) joint of right foot   2. Osteoarthritis of first metatarsophalangeal (MTP) joint of left foot   3. Pes planovalgus    Plan:  Patient was evaluated and treated and all questions answered.  Right first metatarsal phalangeal joint no joint disease/hallux rigidus -I explained to the patient the etiology and various treatment options associated for  hallux rigidus/DJD.  I explained to her that given the amount of arthritis in the future she may need fusion versus arthroplasty of the right first metatarsophalangeal joint.  She states that she understands that but for now she would like to try the injections especially because it is giving her relief.  I agree with the patient I believe since the injections are helpful she will benefit from steroid injection.  As long as her steroid injections give her couple of months of relief I will  continue doing them.  Once the timeframe decreases other interval decreases we will discuss surgical management at that time. -A steroid injection was performed at right first metatarsophalangeal joint using 1% plain Lidocaine and 10 mg of Kenalog. This was well tolerated.  Left first metatarsal phalangeal joint no joint disease/hallux rigidus -I explained to the patient the etiology and various treatment options associated for hallux rigidus/DJD.  I explained to her that given the amount of arthritis in the future she may need fusion versus arthroplasty of the left first metatarsophalangeal joint.  She states that she understands that but for now she would like to try the injections especially because it is giving her relief.  I agree with the patient I believe since the injections are helpful she will benefit from steroid injection.  As long as her steroid injections give her couple of months of relief I will continue doing them.  Once the timeframe decreases other interval decreases we will discuss surgical management at that time. -A steroid injection was performed at right first metatarsophalangeal joint using 1% plain Lidocaine and 10 mg of Kenalog. This was well tolerated.  Pes planovalgus deformity -I explained the patient the etiology of pes planovalgus deformity and various treatment options were extensively discussed.  I believe patient will benefit from custom-made orthotics with Morton's extension of bilateral feet.  This will take the stress off of both of the joints.  Patient agrees with the plan would like to proceed with obtaining orthotics.  She will be scheduled see Liliane Channel for custom-made orthotics  Return for See Liliane Channel for orthotics ASAP.

## 2019-11-16 ENCOUNTER — Ambulatory Visit (INDEPENDENT_AMBULATORY_CARE_PROVIDER_SITE_OTHER): Payer: BC Managed Care – PPO | Admitting: Orthotics

## 2019-11-16 ENCOUNTER — Other Ambulatory Visit: Payer: Self-pay

## 2019-11-16 DIAGNOSIS — M19071 Primary osteoarthritis, right ankle and foot: Secondary | ICD-10-CM | POA: Diagnosis not present

## 2019-11-16 DIAGNOSIS — Q666 Other congenital valgus deformities of feet: Secondary | ICD-10-CM

## 2019-11-16 DIAGNOSIS — M19072 Primary osteoarthritis, left ankle and foot: Secondary | ICD-10-CM

## 2019-11-16 NOTE — Progress Notes (Signed)
Scanned for CMFO to  Address hallux rigidus; plan on semirigid device with Morthons extension built into shell.Marland KitchenMarland Kitchen

## 2019-12-12 ENCOUNTER — Ambulatory Visit: Payer: BC Managed Care – PPO | Admitting: Orthotics

## 2019-12-12 ENCOUNTER — Other Ambulatory Visit: Payer: Self-pay

## 2019-12-12 DIAGNOSIS — Q666 Other congenital valgus deformities of feet: Secondary | ICD-10-CM

## 2019-12-12 DIAGNOSIS — M19072 Primary osteoarthritis, left ankle and foot: Secondary | ICD-10-CM

## 2019-12-12 DIAGNOSIS — M19071 Primary osteoarthritis, right ankle and foot: Secondary | ICD-10-CM

## 2019-12-12 NOTE — Progress Notes (Signed)
Patient came in today to pick up custom made foot orthotics.  The goals were accomplished and the patient reported no dissatisfaction with said orthotics.  Patient was advised of breakin period and how to report any issues. 

## 2020-01-02 ENCOUNTER — Other Ambulatory Visit: Payer: Self-pay

## 2020-01-02 ENCOUNTER — Encounter: Payer: Self-pay | Admitting: Podiatry

## 2020-01-02 ENCOUNTER — Ambulatory Visit (INDEPENDENT_AMBULATORY_CARE_PROVIDER_SITE_OTHER): Payer: BC Managed Care – PPO | Admitting: Podiatry

## 2020-01-02 DIAGNOSIS — M19072 Primary osteoarthritis, left ankle and foot: Secondary | ICD-10-CM | POA: Diagnosis not present

## 2020-01-02 DIAGNOSIS — M19071 Primary osteoarthritis, right ankle and foot: Secondary | ICD-10-CM | POA: Diagnosis not present

## 2020-01-03 ENCOUNTER — Encounter: Payer: Self-pay | Admitting: Podiatry

## 2020-01-03 NOTE — Progress Notes (Signed)
Subjective:  Patient ID: Nicole Romero, female    DOB: 20-Jul-1960,  MRN: [id number]  Chief Complaint  Patient presents with  . Foot Pain    "My right foot is hurting line crazy and my left foot is starting to hurt.  The orthotics I hate them, they make the pain worse in my feet"    58 y.o. female presents with the above complaint.  Patient presents with follow-up of right hallux MPJ arthritis.  Her injection has started to wear off quickly.  Patient states that the flare is continuing to hurt.  The right side is worse than left side.  She states the orthotics have been hurting a little bit as well.  I discussed break-in period.  She denies any other acute complaints.   Review of Systems: Negative except as noted in the HPI. Denies N/V/F/Ch.  No past medical history on file.  Current Outpatient Medications:  .  Calcium Carbonate-Vitamin D 600-400 MG-UNIT tablet, Take by mouth., Disp: , Rfl:  .  Cholecalciferol (VITAMIN D3) 125 MCG (5000 UT) TABS, Take by mouth., Disp: , Rfl:  .  Ivermectin 1 % CREA, Apply topically., Disp: , Rfl:  .  magnesium oxide (MAG-OX) 400 MG tablet, Take by mouth., Disp: , Rfl:  .  meloxicam (MOBIC) 15 MG tablet, TAKE 1 TABLET(15 MG) BY MOUTH DAILY, Disp: 60 tablet, Rfl: 1 .  omeprazole (PRILOSEC) 20 MG capsule, Take by mouth., Disp: , Rfl:  .  pramipexole (MIRAPEX) 0.5 MG tablet, Take by mouth., Disp: , Rfl:  .  tamoxifen (NOLVADEX) 20 MG tablet, TAKE 1 TABLET BY MOUTH EVERY DAY, Disp: , Rfl:   Social History   Tobacco Use  Smoking Status Never Smoker  Smokeless Tobacco Never Used    Allergies  Allergen Reactions  . Other Itching   Objective:  There were no vitals filed for this visit. There is no height or weight on file to calculate BMI. Constitutional Well developed. Well nourished.  Vascular Dorsalis pedis pulses palpable bilaterally. Posterior tibial pulses palpable bilaterally. Capillary refill normal to all digits.  No cyanosis or clubbing  noted. Pedal hair growth normal.  Neurologic Normal speech. Oriented to person, place, and time. Epicritic sensation to light touch grossly present bilaterally.  Dermatologic Nails well groomed and normal in appearance. No open wounds. No skin lesions.  Orthopedic:  Pain on palpation to the right first metatarsophalangeal joint.  Pain with range of motion of the first metatarsophalangeal joint.  Limited range of motion active and passive of the first metatarsophalangeal joint.  There is crepitus PET present.  Less than 10 degrees of dorsiflexion present.  No pain at the IPJ of the right hallux.  Pain on palpation to the left first metatarsophalangeal joint.  Pain with range of motion of the first metatarsophalangeal joint.  Limited range of motion active and passive of the first metatarsophalangeal joint.  There is crepitus PET present.  Less than 15 degrees of dorsiflexion present.  No pain at the IPJ of the right hallux.   Radiographs: None Assessment:   1. Osteoarthritis of first metatarsophalangeal (MTP) joint of right foot   2. Osteoarthritis of first metatarsophalangeal (MTP) joint of left foot    Plan:  Patient was evaluated and treated and all questions answered.  Right first metatarsal phalangeal joint no joint disease/hallux rigidus -I explained to the patient the etiology and various treatment options associated for hallux rigidus/DJD.  I explained to her that given the amount of arthritis in the future  she may need fusion versus arthroplasty of the right first metatarsophalangeal joint.  Given that the injection has failed with orthotics management as well I believe patient will benefit from surgical fusion of the right first metatarsophalangeal joint.  However patient would like to discuss with the family first.  I will see her back again in 2 weeks and will plan for surgical discussion as well as scheduling the case.  Patient states understanding  Left first metatarsal  phalangeal joint no joint disease/hallux rigidus -I explained to the patient the etiology and various treatment options associated for hallux rigidus/DJD.  I explained to her that given the amount of arthritis in the future she may need fusion versus arthroplasty of the left first metatarsophalangeal joint.  She states that she understands that but for now she would like to try the injections especially because it is giving her relief.  I agree with the patient I believe since the injections are helpful she will benefit from steroid injection.  As long as her steroid injections give her couple of months of relief I will continue doing them.  Once the timeframe decreases other interval decreases we will discuss surgical management at that time. -A steroid injection was performed at right first metatarsophalangeal joint using 1% plain Lidocaine and 10 mg of Kenalog. This was well tolerated.  Pes planovalgus deformity -I explained the patient the etiology of pes planovalgus deformity and various treatment options were extensively discussed.  I believe patient will benefit from custom-made orthotics with Morton's extension of bilateral feet.  This will take the stress off of both of the joints. -Patient received the orthotics and has been utilizing them.  They appear to be functioning well.  Return in about 2 weeks (around 01/16/2020) for surgical consult right foot.

## 2020-01-18 ENCOUNTER — Ambulatory Visit (INDEPENDENT_AMBULATORY_CARE_PROVIDER_SITE_OTHER): Payer: BC Managed Care – PPO

## 2020-01-18 ENCOUNTER — Encounter: Payer: Self-pay | Admitting: Podiatry

## 2020-01-18 ENCOUNTER — Ambulatory Visit: Payer: BC Managed Care – PPO | Admitting: Podiatry

## 2020-01-18 ENCOUNTER — Other Ambulatory Visit: Payer: Self-pay

## 2020-01-18 DIAGNOSIS — M2021 Hallux rigidus, right foot: Secondary | ICD-10-CM | POA: Diagnosis not present

## 2020-01-18 DIAGNOSIS — M19071 Primary osteoarthritis, right ankle and foot: Secondary | ICD-10-CM

## 2020-01-18 NOTE — Patient Instructions (Signed)
Pre-Operative Instructions  Congratulations, you have decided to take an important step towards improving your quality of life.  You can be assured that the doctors and staff at [facility] will be with you every step of the way.  Here are some important things you should know:  1. Plan to be at the surgery center/hospital at least 1 (one) hour prior to your scheduled time, unless otherwise directed by the surgical center/hospital staff.  You must have a responsible adult accompany you, remain during the surgery and drive you home.  Make sure you have directions to the surgical center/hospital to ensure you arrive on time. 2. If you are having surgery at [facility] or [facility] hospitals, you will need a copy of your medical history and physical form from your family physician within one month prior to the date of surgery. We will give you a form for your primary physician to complete.  3. We make every effort to accommodate the date you request for surgery.  However, there are times where surgery dates or times have to be moved.  We will contact you as soon as possible if a change in schedule is required.   4. No aspirin/ibuprofen for one week before surgery.  If you are on aspirin, any non-steroidal anti-inflammatory medications (Mobic, Aleve, Ibuprofen) should not be taken seven (7) days prior to your surgery.  You make take Tylenol for pain prior to surgery.  5. Medications - If you are taking daily heart and blood pressure medications, seizure, reflux, allergy, asthma, anxiety, pain or diabetes medications, make sure you notify the surgery center/hospital before the day of surgery so they can tell you which medications you should take or avoid the day of surgery. 6. No food or drink after midnight the night before surgery unless directed otherwise by surgical center/hospital staff. 7. No alcoholic beverages 24-hours prior to surgery.  No smoking 24-hours prior or 24-hours after  surgery. 8. Wear loose pants or shorts. They should be loose enough to fit over bandages, boots, and casts. 9. Don't wear slip-on shoes. Sneakers are preferred. 10. Bring your boot with you to the surgery center/hospital.  Also bring crutches or a walker if your physician has prescribed it for you.  If you do not have this equipment, it will be provided for you after surgery. 11. If you have not been contacted by the surgery center/hospital by the day before your surgery, call to confirm the date and time of your surgery. 12. Leave-time from work may vary depending on the type of surgery you have.  Appropriate arrangements should be made prior to surgery with your employer. 13. Prescriptions will be provided immediately following surgery by your doctor.  Fill these as soon as possible after surgery and take the medication as directed. Pain medications will not be refilled on weekends and must be approved by the doctor. 14. Remove nail polish on the operative foot and avoid getting pedicures prior to surgery. 15. Wash the night before surgery.  The night before surgery wash the foot and leg well with water and the antibacterial soap provided. Be sure to pay special attention to beneath the toenails and in between the toes.  Wash for at least three (3) minutes. Rinse thoroughly with water and dry well with a towel.  Perform this wash unless told not to do so by your physician.  Enclosed: 1 Ice pack (please put in freezer the night before surgery)   1 Hibiclens skin cleaner     Pre-op instructions  If you have any questions regarding the instructions, please do not hesitate to call our office.  [place]: [address], [place], [place] [address] -- [phone]  [place]: [address] [place], [place] [address] -- [phone]  [place]: [address], [place], [place] [address] -- [phone]   Website: [email or web address] 

## 2020-01-23 ENCOUNTER — Encounter: Payer: Self-pay | Admitting: Podiatry

## 2020-01-23 NOTE — Progress Notes (Signed)
Subjective:  Patient ID: Nicole Romero, female    DOB: 12/03/1960,  MRN: [id number]  Chief Complaint  Patient presents with  . Foot Pain    surgical consult    59 y.o. female presents with the above complaint.  Patient presents with follow-up of right hallux MPJ arthritis.  She states that she is ready to schedule the surgery after discussing with her family.  She is here for surgical consultation.  She has failed all conservative therapy at this time.   Review of Systems: Negative except as noted in the HPI. Denies N/V/F/Ch.  No past medical history on file.  Current Outpatient Medications:  .  Calcium Carbonate-Vitamin D 600-400 MG-UNIT tablet, Take by mouth., Disp: , Rfl:  .  Cholecalciferol (VITAMIN D3) 125 MCG (5000 UT) TABS, Take by mouth., Disp: , Rfl:  .  Ivermectin 1 % CREA, Apply topically., Disp: , Rfl:  .  magnesium oxide (MAG-OX) 400 MG tablet, Take by mouth., Disp: , Rfl:  .  meloxicam (MOBIC) 15 MG tablet, TAKE 1 TABLET(15 MG) BY MOUTH DAILY, Disp: 60 tablet, Rfl: 1 .  omeprazole (PRILOSEC) 20 MG capsule, Take by mouth., Disp: , Rfl:  .  pramipexole (MIRAPEX) 0.5 MG tablet, Take by mouth., Disp: , Rfl:  .  tamoxifen (NOLVADEX) 20 MG tablet, TAKE 1 TABLET BY MOUTH EVERY DAY, Disp: , Rfl:   Social History   Tobacco Use  Smoking Status Never Smoker  Smokeless Tobacco Never Used    Allergies  Allergen Reactions  . Other Itching   Objective:  There were no vitals filed for this visit. There is no height or weight on file to calculate BMI. Constitutional Well developed. Well nourished.  Vascular Dorsalis pedis pulses palpable bilaterally. Posterior tibial pulses palpable bilaterally. Capillary refill normal to all digits.  No cyanosis or clubbing noted. Pedal hair growth normal.  Neurologic Normal speech. Oriented to person, place, and time. Epicritic sensation to light touch grossly present bilaterally.  Dermatologic Nails well groomed and normal in  appearance. No open wounds. No skin lesions.  Orthopedic:  Pain on palpation to the right first metatarsophalangeal joint.  Pain with range of motion of the first metatarsophalangeal joint.  Limited range of motion active and passive of the first metatarsophalangeal joint.  There is crepitus PET present.  Less than 10 degrees of dorsiflexion present.  No pain at the IPJ of the right hallux.  Pain on palpation to the left first metatarsophalangeal joint.  Pain with range of motion of the first metatarsophalangeal joint.  Limited range of motion active and passive of the first metatarsophalangeal joint.  There is crepitus PET present.  Less than 15 degrees of dorsiflexion present.  No pain at the IPJ of the right hallux.   Radiographs: 3 views of skeletally mature adult right foot: Osteoarthritic changes noted to the first metatarsophalangeal joint with on loose osteophytes.  This appears to be severe in nature.  No osteoarthritic changes noted to the interphalangeal joint. Assessment:   1. Primary osteoarthritis of right foot   2. Hallux rigidus of right foot    Plan:  Patient was evaluated and treated and all questions answered.  Right first metatarsal phalangeal joint no joint disease/hallux rigidus -I explained to the patient the etiology and various treatment options associated for hallux rigidus/DJD.  I explained to her that given the amount of arthritis in the future she may need fusion versus arthroplasty of the right first metatarsophalangeal joint.  Given that patient has failed orthotics  as well as injections I believe patient will benefit from right first metatarsophalangeal joint fusion.  I discussed this with the patient extensile details.  Patient is here today to schedule the surgery and she would like to proceed with the procedure as it is causing her a lot of pain.  She has failed all conservative therapy including injection, orthotics management, shoe gear modification and at this  point patient is an ideal candidate for surgical fusion.  I discussed with the with the patient that given that she already has a very stiff joint I plan on just making it stiffer and allow the first MPJ to completely fused.  I explained to her that the activities that she is able to do now with stiff joint is pretty much going to be the similar activity she will be able to do afterwards.  I discussed with her my surgical plans as well as my postop protocol in extensive detail.  Patient will be nonweightbearing to the right lower extremity in a cam boot with a knee scooter for first 3 to 4 weeks followed by weightbearing as tolerated in cam boot.  Patient states understanding would like to proceed with the fusion to the right side. -Cam boot was dispensed -Informed surgical risk consent was reviewed and read aloud to the patient.  I reviewed the films.  I have discussed my findings with the patient in great detail.  I have discussed all risks including but not limited to infection, stiffness, scarring, limp, disability, deformity, damage to blood vessels and nerves, numbness, poor healing, need for braces, arthritis, chronic pain, amputation, death.  All benefits and realistic expectations discussed in great detail.  I have made no promises as to the outcome.  I have provided realistic expectations.  I have offered the patient a 2nd opinion, which they have declined and assured me they preferred to proceed despite the risks -A total of 37 minutes was spent in direct patient care as well as pre and post patient encounter activities.  This includes documentation as well as reviewing patient chart for labs, imaging, past medical, surgical, social, and family history as documented in the EMR.  I have reviewed medication allergies as documented in EMR.  I discussed the etiology of condition and treatment options from conservative to surgical care.  All risks and benefit of the treatment course was discussed in detail.   All questions were answered and return appointment was discussed.  Since the visit completed in an ambulatory/outpatient setting, the patient and/or parent/guardian has been advised to contact the providers office for worsening condition and seek medical treatment and/or call 911 if the patient deems either is necessary.   Left first metatarsal phalangeal joint no joint disease/hallux rigidus -I explained to the patient the etiology and various treatment options associated for hallux rigidus/DJD.  I explained to her that given the amount of arthritis in the future she may need fusion versus arthroplasty of the left first metatarsophalangeal joint.  She states that she understands that but for now she would like to try the injections especially because it is giving her relief.  I agree with the patient I believe since the injections are helpful she will benefit from steroid injection.  As long as her steroid injections give her couple of months of relief I will continue doing them.  Once the timeframe decreases other interval decreases we will discuss surgical management at that time. -A steroid injection was performed at right first metatarsophalangeal joint using 1% plain  Lidocaine and 10 mg of Kenalog. This was well tolerated.  Pes planovalgus deformity -I explained the patient the etiology of pes planovalgus deformity and various treatment options were extensively discussed.  I believe patient will benefit from custom-made orthotics with Morton's extension of bilateral feet.  This will take the stress off of both of the joints. -Patient received the orthotics and has been utilizing them.  They appear to be functioning well.  No follow-ups on file.

## 2020-03-26 ENCOUNTER — Telehealth: Payer: Self-pay

## 2020-03-26 NOTE — Telephone Encounter (Signed)
DOS 04/08/2020  HALLUX MPJ FUSION RT - 28750  [employer] ST EFFECTIVE DATE - 05/19/2019  PLAN DEDUCTIBLE - $1250.00 W/ $1250.00 REMAINING OUT OF POCKET - $4890.00 W/ $4232.00 REMAINING COPAY $0.00 COINSURANCE - 20% PER SERVICE YEAR  NO AUTH REQUIRED PER WEBSITE

## 2020-04-08 DIAGNOSIS — M13871 Other specified arthritis, right ankle and foot: Secondary | ICD-10-CM | POA: Diagnosis not present

## 2020-04-08 MED ORDER — OXYCODONE-ACETAMINOPHEN 10-325 MG PO TABS: 1.0000 | ORAL_TABLET | ORAL | 0 refills | Status: AC | PRN

## 2020-04-08 MED ORDER — IBUPROFEN 800 MG PO TABS: 800.0000 mg | ORAL_TABLET | Freq: Four times a day (QID) | ORAL | 1 refills | Status: DC | PRN

## 2020-04-08 NOTE — Addendum Note (Signed)
Addended by: Boneta Lucks on: 04/08/2020 09:19 AM   Modules accepted: Orders

## 2020-04-16 ENCOUNTER — Other Ambulatory Visit: Payer: Self-pay

## 2020-04-16 ENCOUNTER — Ambulatory Visit (INDEPENDENT_AMBULATORY_CARE_PROVIDER_SITE_OTHER): Payer: BC Managed Care – PPO | Admitting: Podiatry

## 2020-04-16 ENCOUNTER — Encounter: Payer: Self-pay | Admitting: Podiatry

## 2020-04-16 ENCOUNTER — Ambulatory Visit (INDEPENDENT_AMBULATORY_CARE_PROVIDER_SITE_OTHER): Payer: BC Managed Care – PPO

## 2020-04-16 VITALS — BP 136/89 | HR 117 | Temp 97.8°F

## 2020-04-16 DIAGNOSIS — Z9889 Other specified postprocedural states: Secondary | ICD-10-CM

## 2020-04-16 DIAGNOSIS — M2021 Hallux rigidus, right foot: Secondary | ICD-10-CM

## 2020-04-16 NOTE — Progress Notes (Signed)
  Subjective:  Patient ID: Nicole Romero, female    DOB: 18-Jul-1960,  MRN: [id number]  Chief Complaint  Patient presents with  . Routine Post Op     POV #1 DOS 04/08/20 HALLUX MPJ FUSION RT     59 y.o. female returns for post-op check. Patient is doing well.  Her pain is well manageable/tolerable.  She has been nonweightbearing with a cam boot on a knee scooter.  She denies any other acute complaints.  Her bandages were clean dry and intact.  No calf pain.  Review of Systems: Negative except as noted in the HPI. Denies N/V/F/Ch.  No past medical history on file.  Current Outpatient Medications:  .  Calcium Carbonate-Vitamin D 600-400 MG-UNIT tablet, Take by mouth., Disp: , Rfl:  .  Cholecalciferol (VITAMIN D3) 125 MCG (5000 UT) TABS, Take by mouth., Disp: , Rfl:  .  ibuprofen (ADVIL) 800 MG tablet, Take 1 tablet (800 mg total) by mouth every 6 (six) hours as needed., Disp: 60 tablet, Rfl: 1 .  Ivermectin 1 % CREA, Apply topically., Disp: , Rfl:  .  magnesium oxide (MAG-OX) 400 MG tablet, Take by mouth., Disp: , Rfl:  .  meloxicam (MOBIC) 15 MG tablet, TAKE 1 TABLET(15 MG) BY MOUTH DAILY, Disp: 60 tablet, Rfl: 1 .  omeprazole (PRILOSEC) 20 MG capsule, Take by mouth., Disp: , Rfl:  .  oxyCODONE-acetaminophen (PERCOCET) 10-325 MG tablet, Take 1 tablet by mouth every 4 (four) hours as needed for pain., Disp: 30 tablet, Rfl: 0 .  pramipexole (MIRAPEX) 0.5 MG tablet, Take by mouth., Disp: , Rfl:  .  tamoxifen (NOLVADEX) 20 MG tablet, TAKE 1 TABLET BY MOUTH EVERY DAY, Disp: , Rfl:   Social History   Tobacco Use  Smoking Status Never Smoker  Smokeless Tobacco Never Used    Allergies  Allergen Reactions  . Other Itching   Objective:   Vitals:   04/16/20 1357  BP: 136/89  Pulse: (!) 117  Temp: 97.8 F (36.6 C)   There is no height or weight on file to calculate BMI. Constitutional Well developed. Well nourished.  Vascular Foot warm and well perfused. Capillary refill normal to  all digits.   Neurologic Normal speech. Oriented to person, place, and time. Epicritic sensation to light touch grossly present bilaterally.  Dermatologic Skin healing well without signs of infection. Skin edges well coapted without signs of infection.  Orthopedic: Tenderness to palpation noted about the surgical site.   Radiographs: 3 views of skeletally mature the right foot: Hardware is intact.  No signs of loosening or backing out noted.  Good consolidation noted across the fusion site Assessment:   1. Hallux rigidus of right foot   2. S/P foot surgery, right    Plan:  Patient was evaluated and treated and all questions answered.  S/p foot surgery right -Progressing as expected post-operatively. -XR: See above -WB Status: Nonweightbearing in right lower extremity knee scooter -Sutures: Intact.  No signs of dehiscence noted.  No clinical signs of infection noted. -Medications: None -Foot redressed.  No follow-ups on file.

## 2020-04-23 ENCOUNTER — Encounter: Payer: BC Managed Care – PPO | Admitting: Podiatry

## 2020-04-30 ENCOUNTER — Encounter: Payer: BC Managed Care – PPO | Admitting: Podiatry

## 2020-05-02 ENCOUNTER — Ambulatory Visit (INDEPENDENT_AMBULATORY_CARE_PROVIDER_SITE_OTHER): Payer: BC Managed Care – PPO

## 2020-05-02 ENCOUNTER — Other Ambulatory Visit: Payer: Self-pay

## 2020-05-02 ENCOUNTER — Ambulatory Visit (INDEPENDENT_AMBULATORY_CARE_PROVIDER_SITE_OTHER): Payer: BC Managed Care – PPO | Admitting: Podiatry

## 2020-05-02 ENCOUNTER — Encounter: Payer: Self-pay | Admitting: Podiatry

## 2020-05-02 DIAGNOSIS — M2021 Hallux rigidus, right foot: Secondary | ICD-10-CM

## 2020-05-02 DIAGNOSIS — Z9889 Other specified postprocedural states: Secondary | ICD-10-CM

## 2020-05-02 NOTE — Progress Notes (Signed)
  Subjective:  Patient ID: Nicole Romero, female    DOB: [birth year],  MRN: [id number]  Chief Complaint  Patient presents with  . Routine Post Op    Post Op 3 - DOS 04/08/20 HALLUX MPJ FUSION RT  "its doing better.  I have sharp pains that shoot through my foot on occasion but otherwise doing good"     59 y.o. female returns for post-op check. Patient is doing well.  Her pain is well controlled.  She has not been taking any pain medication.  She is ready to transition out of the boot.  Review of Systems: Negative except as noted in the HPI. Denies N/V/F/Ch.  No past medical history on file.  Current Outpatient Medications:  .  Calcium Carbonate-Vitamin D 600-400 MG-UNIT tablet, Take by mouth., Disp: , Rfl:  .  Cholecalciferol (VITAMIN D3) 125 MCG (5000 UT) TABS, Take by mouth., Disp: , Rfl:  .  ibuprofen (ADVIL) 800 MG tablet, Take 1 tablet (800 mg total) by mouth every 6 (six) hours as needed., Disp: 60 tablet, Rfl: 1 .  Ivermectin 1 % CREA, Apply topically., Disp: , Rfl:  .  magnesium oxide (MAG-OX) 400 MG tablet, Take by mouth., Disp: , Rfl:  .  meloxicam (MOBIC) 15 MG tablet, TAKE 1 TABLET(15 MG) BY MOUTH DAILY, Disp: 60 tablet, Rfl: 1 .  omeprazole (PRILOSEC) 20 MG capsule, Take by mouth., Disp: , Rfl:  .  omeprazole (PRILOSEC) 40 MG capsule, Take 40 mg by mouth daily., Disp: , Rfl:  .  oxyCODONE-acetaminophen (PERCOCET) 10-325 MG tablet, Take 1 tablet by mouth every 4 (four) hours as needed for pain., Disp: 30 tablet, Rfl: 0 .  pramipexole (MIRAPEX) 0.5 MG tablet, Take by mouth., Disp: , Rfl:  .  tamoxifen (NOLVADEX) 20 MG tablet, TAKE 1 TABLET BY MOUTH EVERY DAY, Disp: , Rfl:   Social History   Tobacco Use  Smoking Status Never Smoker  Smokeless Tobacco Never Used    Allergies  Allergen Reactions  . Other Itching   Objective:   There were no vitals filed for this visit. There is no height or weight on file to calculate BMI. Constitutional Well developed. Well  nourished.  Vascular Foot warm and well perfused. Capillary refill normal to all digits.   Neurologic Normal speech. Oriented to person, place, and time. Epicritic sensation to light touch grossly present bilaterally.  Dermatologic   Stiffness noted across the first MPJ site.  Skin completely reepithelialized  Orthopedic: Tenderness to palpation noted about the surgical site.   Radiographs: 3 views of skeletally mature the right foot: Hardware is intact.  No signs of loosening or backing out noted.  Good consolidation noted across the fusion site Assessment:   1. Hallux rigidus of right foot   2. S/P foot surgery, right    Plan:  Patient was evaluated and treated and all questions answered.  S/p foot surgery right -Progressing as expected post-operatively. -XR: See above -WB Status: Weightbearing as tolerated in surgical shoe.  Surgical shoe was dispensed -Sutures: Removed.  No signs of dehiscence noted.  No clinical signs of infection noted. -Medications: None -Foot redressed.  No follow-ups on file.

## 2020-05-04 ENCOUNTER — Other Ambulatory Visit: Payer: Self-pay | Admitting: Podiatry

## 2020-05-05 NOTE — Telephone Encounter (Signed)
Please advise 

## 2020-05-07 ENCOUNTER — Encounter: Payer: BC Managed Care – PPO | Admitting: Podiatry

## 2020-05-09 ENCOUNTER — Encounter: Payer: BC Managed Care – PPO | Admitting: Podiatry

## 2020-05-28 ENCOUNTER — Other Ambulatory Visit: Payer: Self-pay

## 2020-05-28 ENCOUNTER — Encounter: Payer: Self-pay | Admitting: Podiatry

## 2020-05-28 ENCOUNTER — Ambulatory Visit (INDEPENDENT_AMBULATORY_CARE_PROVIDER_SITE_OTHER): Payer: Self-pay | Admitting: Podiatry

## 2020-05-28 ENCOUNTER — Ambulatory Visit (INDEPENDENT_AMBULATORY_CARE_PROVIDER_SITE_OTHER): Payer: BC Managed Care – PPO

## 2020-05-28 DIAGNOSIS — Z9889 Other specified postprocedural states: Secondary | ICD-10-CM

## 2020-05-28 DIAGNOSIS — M2021 Hallux rigidus, right foot: Secondary | ICD-10-CM

## 2020-05-28 DIAGNOSIS — B351 Tinea unguium: Secondary | ICD-10-CM

## 2020-05-28 NOTE — Progress Notes (Signed)
Subjective:  Patient ID: Nicole Romero, female    DOB: 26-Oct-1960,  MRN: [id number]  Chief Complaint  Patient presents with  . Routine Post Op     DOS 04/08/20 HALLUX MPJ FUSION RT  "its doing great.  Today is the first day wearing shoes"     60 y.o. female returns for post-op check. Patient is doing well.  Her pain is well controlled.  She has not been taking any pain medication.  She has been ambulating regular single without any problems.  She has secondary complaint of right hallux onychomycosis.  She would like to discuss treatment options  Review of Systems: Negative except as noted in the HPI. Denies N/V/F/Ch.  No past medical history on file.  Current Outpatient Medications:  .  Calcium Carbonate-Vitamin D 600-400 MG-UNIT tablet, Take by mouth., Disp: , Rfl:  .  Cholecalciferol (VITAMIN D3) 125 MCG (5000 UT) TABS, Take by mouth., Disp: , Rfl:  .  ibuprofen (ADVIL) 800 MG tablet, TAKE 1 TABLET(800 MG) BY MOUTH EVERY 6 HOURS AS NEEDED, Disp: 60 tablet, Rfl: 1 .  Ivermectin 1 % CREA, Apply topically., Disp: , Rfl:  .  magnesium oxide (MAG-OX) 400 MG tablet, Take by mouth., Disp: , Rfl:  .  meloxicam (MOBIC) 15 MG tablet, TAKE 1 TABLET(15 MG) BY MOUTH DAILY, Disp: 60 tablet, Rfl: 1 .  omeprazole (PRILOSEC) 20 MG capsule, Take by mouth., Disp: , Rfl:  .  omeprazole (PRILOSEC) 40 MG capsule, Take 40 mg by mouth daily., Disp: , Rfl:  .  oxyCODONE-acetaminophen (PERCOCET) 10-325 MG tablet, Take 1 tablet by mouth every 4 (four) hours as needed for pain., Disp: 30 tablet, Rfl: 0 .  pramipexole (MIRAPEX) 0.5 MG tablet, Take by mouth., Disp: , Rfl:  .  tamoxifen (NOLVADEX) 20 MG tablet, TAKE 1 TABLET BY MOUTH EVERY DAY, Disp: , Rfl:   Social History   Tobacco Use  Smoking Status Never Smoker  Smokeless Tobacco Never Used    Allergies  Allergen Reactions  . Other Itching   Objective:   There were no vitals filed for this visit. There is no height or weight on file to calculate  BMI. Constitutional Well developed. Well nourished.  Vascular Foot warm and well perfused. Capillary refill normal to all digits.   Neurologic Normal speech. Oriented to person, place, and time. Epicritic sensation to light touch grossly present bilaterally.  Dermatologic   Stiffness noted across the first MPJ site.  Skin completely reepithelialized  Orthopedic:  No tenderness to palpation noted about the surgical site.   Radiographs: 3 views of skeletally mature the right foot: Hardware is intact.  No signs of loosening or backing out noted.  Fusion noted across the fusion site. Assessment:   1. Hallux rigidus of right foot   2. S/P foot surgery, right    Plan:  Patient was evaluated and treated and all questions answered.  S/p foot surgery right -Progressing as expected post-operatively. -XR: See above -WB Status: Weightbearing as tolerated in regular shoes -Sutures: Removed.  No signs of dehiscence noted.  No clinical signs of infection noted. -Medications: None -At this time clinically patient is doing really well.  I have discussed with her the importance of shoe gear modification and orthotics.  If there is any foot and ankle issues arise in the future come back and see me.  Patient states understanding  Right hallux onychomycosis -Educated the patient on the etiology of onychomycosis and various treatment options associated with improving the fungal  load.  I explained to the patient that there is 3 treatment options available to treat the onychomycosis including topical, p.o., laser treatment.  Patient has elected to go laser therapy.  I discussed with the patient the financial cost of the therapy.  I discussed with the patient the benefits of doing laser versus Lamisil.  She will be scheduled see Caryl Pina for laser treatment   No follow-ups on file.

## 2020-05-30 ENCOUNTER — Encounter: Payer: BC Managed Care – PPO | Admitting: Podiatry

## 2020-06-07 ENCOUNTER — Other Ambulatory Visit: Payer: Self-pay | Admitting: Podiatry

## 2020-06-24 ENCOUNTER — Other Ambulatory Visit: Payer: Self-pay

## 2020-06-24 ENCOUNTER — Ambulatory Visit (INDEPENDENT_AMBULATORY_CARE_PROVIDER_SITE_OTHER): Payer: BC Managed Care – PPO | Admitting: *Deleted

## 2020-06-24 DIAGNOSIS — B351 Tinea unguium: Secondary | ICD-10-CM

## 2020-06-24 DIAGNOSIS — L603 Nail dystrophy: Secondary | ICD-10-CM

## 2020-06-24 NOTE — Patient Instructions (Signed)

## 2020-06-24 NOTE — Progress Notes (Signed)
Patient presents today for the 1st laser treatment. Diagnosed with mycotic nail infection by Dr. Posey Pronto.   Toenail most affected are the hallux nails bilateral.  All other systems are negative.  Nails were filed thin. Laser therapy was administered to 1st toenails bilateral and patient tolerated the treatment well. All safety precautions were in place.    Follow up in 4 weeks for laser # 2.  Picture of nails taken today to document visual progress

## 2020-07-22 ENCOUNTER — Other Ambulatory Visit: Payer: Self-pay

## 2020-07-22 ENCOUNTER — Ambulatory Visit (INDEPENDENT_AMBULATORY_CARE_PROVIDER_SITE_OTHER): Payer: BC Managed Care – PPO

## 2020-07-22 DIAGNOSIS — B351 Tinea unguium: Secondary | ICD-10-CM

## 2020-07-22 DIAGNOSIS — L603 Nail dystrophy: Secondary | ICD-10-CM

## 2020-07-22 NOTE — Progress Notes (Signed)
Patient presents today for the 2nd laser treatment. Diagnosed with mycotic nail infection by Dr. Posey Pronto.   Toenail most affected are the hallux nails bilateral.  All other systems are negative.  Nails were filed thin. Laser therapy was administered to 1st toenails bilateral and patient tolerated the treatment well. All safety precautions were in place.    Follow up in 4 weeks for laser # 3.

## 2020-08-19 ENCOUNTER — Other Ambulatory Visit: Payer: Self-pay

## 2020-08-19 ENCOUNTER — Ambulatory Visit (INDEPENDENT_AMBULATORY_CARE_PROVIDER_SITE_OTHER): Payer: BC Managed Care – PPO

## 2020-08-19 DIAGNOSIS — B351 Tinea unguium: Secondary | ICD-10-CM

## 2020-08-19 DIAGNOSIS — L603 Nail dystrophy: Secondary | ICD-10-CM

## 2020-08-19 NOTE — Progress Notes (Signed)
Patient presents today for the 3rd laser treatment. Diagnosed with mycotic nail infection by Dr. Posey Pronto.   Toenail most affected are the hallux nails bilateral.  All other systems are negative.  Nails were filed thin. Laser therapy was administered to 1st toenails bilateral and patient tolerated the treatment well. All safety precautions were in place.    Follow up in 6 weeks for laser # 4.

## 2020-09-12 ENCOUNTER — Ambulatory Visit (INDEPENDENT_AMBULATORY_CARE_PROVIDER_SITE_OTHER): Payer: BC Managed Care – PPO | Admitting: Podiatry

## 2020-09-12 ENCOUNTER — Ambulatory Visit (INDEPENDENT_AMBULATORY_CARE_PROVIDER_SITE_OTHER): Payer: BC Managed Care – PPO

## 2020-09-12 ENCOUNTER — Encounter: Payer: Self-pay | Admitting: Podiatry

## 2020-09-12 ENCOUNTER — Other Ambulatory Visit: Payer: Self-pay

## 2020-09-12 DIAGNOSIS — M7752 Other enthesopathy of left foot: Secondary | ICD-10-CM

## 2020-09-12 DIAGNOSIS — M2022 Hallux rigidus, left foot: Secondary | ICD-10-CM | POA: Diagnosis not present

## 2020-09-17 ENCOUNTER — Encounter: Payer: Self-pay | Admitting: Podiatry

## 2020-09-17 NOTE — Progress Notes (Signed)
Subjective:  Patient ID: Nicole Romero, female    DOB: 10-Nov-1960,  MRN: [id number]  Chief Complaint  Patient presents with  . Foot Pain    Patient presents today for left 1st mpj pain x 1-2 months now. She says it throbs and is painful when she is walking     60 y.o. 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  She states been going for 1 to 2 months has progressive gotten worse it throbs and is painful when walking.  She would like to get a steroid injection.  She is thinking about possibly doing surgery on the left side as well.  She is doing really well on the right side.  Review of Systems: Negative except as noted in the HPI. Denies N/V/F/Ch.  No past medical history on file.  Current Outpatient Medications:  .  Calcium Carbonate-Vitamin D 600-400 MG-UNIT tablet, Take by mouth., Disp: , Rfl:  .  Cholecalciferol (VITAMIN D3) 125 MCG (5000 UT) TABS, Take by mouth., Disp: , Rfl:  .  ibuprofen (ADVIL) 800 MG tablet, TAKE 1 TABLET(800 MG) BY MOUTH EVERY 6 HOURS AS NEEDED, Disp: 60 tablet, Rfl: 1 .  Ivermectin 1 % CREA, Apply topically., Disp: , Rfl:  .  magnesium oxide (MAG-OX) 400 MG tablet, Take by mouth., Disp: , Rfl:  .  meloxicam (MOBIC) 15 MG tablet, TAKE 1 TABLET(15 MG) BY MOUTH DAILY, Disp: 60 tablet, Rfl: 1 .  omeprazole (PRILOSEC) 20 MG capsule, Take by mouth., Disp: , Rfl:  .  omeprazole (PRILOSEC) 40 MG capsule, Take 40 mg by mouth daily., Disp: , Rfl:  .  oxyCODONE-acetaminophen (PERCOCET) 10-325 MG tablet, Take 1 tablet by mouth every 4 (four) hours as needed for pain., Disp: 30 tablet, Rfl: 0 .  pramipexole (MIRAPEX) 0.5 MG tablet, Take by mouth., Disp: , Rfl:  .  tamoxifen (NOLVADEX) 20 MG tablet, TAKE 1 TABLET BY MOUTH EVERY DAY, Disp: , Rfl:   Social History   Tobacco Use  Smoking Status Never Smoker  Smokeless Tobacco Never Used    Allergies  Allergen Reactions  . Other Itching   Objective:   There  were no vitals filed for this visit. There is no height or weight on file to calculate BMI. Constitutional Well developed. Well nourished.  Vascular Foot warm and well perfused. Capillary refill normal to all digits.   Neurologic Normal speech. Oriented to person, place, and time. Epicritic sensation to light touch grossly present bilaterally.  Dermatologic   Stiffness noted across the first MPJ site.  Skin completely reepithelialized  Pain with range of motion left metatarsophalangeal joint.  Pain on palpation to the first MPJ.  Deep intra-articular pain noted.  No pain at the second metatarsophalangeal joint.  No neuroma noted.  Orthopedic:  No tenderness to palpation noted about the surgical site.   Radiographs: 3 views of skeletally mature the right foot: Hardware is intact.  No signs of loosening or backing out noted.  Fusion noted across the fusion site. Assessment:   1. Hallux rigidus of left foot   2. Capsulitis of metatarsophalangeal (MTP) joint of left foot    Plan:  Patient was evaluated and treated and all questions answered.  Left first metatarsophalangeal joint arthritis/capsulitis -I explained the patient the etiology of arthritis and various treatment options were discussed.  Given that this is started to flareup I believe patient will benefit from a steroid injection.  I discussed with her that given that  she is doing well on the right side with MPJ fusion we can discuss doing the left side as well if there is no improvement with steroid injection.  She states understanding. -A steroid injection was performed at left first MPJ using 1% plain Lidocaine and 10 mg of Kenalog. This was well tolerated.   S/p foot surgery right Has completely epithelialized no acute complaints  Right hallux onychomycosis -Clinically improving   No follow-ups on file.

## 2020-10-11 ENCOUNTER — Other Ambulatory Visit: Payer: BC Managed Care – PPO

## 2020-10-11 ENCOUNTER — Other Ambulatory Visit: Payer: Self-pay

## 2020-10-24 ENCOUNTER — Other Ambulatory Visit: Payer: Self-pay

## 2020-10-24 ENCOUNTER — Encounter: Payer: Self-pay | Admitting: Podiatry

## 2020-10-24 ENCOUNTER — Ambulatory Visit (INDEPENDENT_AMBULATORY_CARE_PROVIDER_SITE_OTHER): Payer: BC Managed Care – PPO | Admitting: Podiatry

## 2020-10-24 DIAGNOSIS — M7752 Other enthesopathy of left foot: Secondary | ICD-10-CM | POA: Diagnosis not present

## 2020-10-24 NOTE — Progress Notes (Signed)
Subjective:  Patient ID: Nicole Romero, female    DOB: 11/30/1960,  MRN: [id number]  Chief Complaint  Patient presents with   Foot Pain    Pt stated that she is having some pain with her foot and would like an injection      60 y.o. 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  She states that she started get a mild flareup.  She is going out of the country for to [place] and [place] countries.  She states that she would like to get an injection prior to it as she has not been doing a lot on her foot.  She denies any other acute complaints.  Review of Systems: Negative except as noted in the HPI. Denies N/V/F/Ch.  No past medical history on file.  Current Outpatient Medications:    Calcium Carbonate-Vitamin D 600-400 MG-UNIT tablet, Take by mouth., Disp: , Rfl:    Cholecalciferol (VITAMIN D3) 125 MCG (5000 UT) TABS, Take by mouth., Disp: , Rfl:    ibuprofen (ADVIL) 800 MG tablet, TAKE 1 TABLET(800 MG) BY MOUTH EVERY 6 HOURS AS NEEDED, Disp: 60 tablet, Rfl: 1   Ivermectin 1 % CREA, Apply topically., Disp: , Rfl:    magnesium oxide (MAG-OX) 400 MG tablet, Take by mouth., Disp: , Rfl:    meloxicam (MOBIC) 15 MG tablet, TAKE 1 TABLET(15 MG) BY MOUTH DAILY, Disp: 60 tablet, Rfl: 1   omeprazole (PRILOSEC) 20 MG capsule, Take by mouth., Disp: , Rfl:    omeprazole (PRILOSEC) 40 MG capsule, Take 40 mg by mouth daily., Disp: , Rfl:    oxyCODONE-acetaminophen (PERCOCET) 10-325 MG tablet, Take 1 tablet by mouth every 4 (four) hours as needed for pain., Disp: 30 tablet, Rfl: 0   pramipexole (MIRAPEX) 0.5 MG tablet, Take by mouth., Disp: , Rfl:    tamoxifen (NOLVADEX) 20 MG tablet, TAKE 1 TABLET BY MOUTH EVERY DAY, Disp: , Rfl:   Social History   Tobacco Use  Smoking Status Never  Smokeless Tobacco Never    Allergies  Allergen Reactions   Other Itching   Objective:   There were no vitals filed for this visit. There is no height or  weight on file to calculate BMI. Constitutional Well developed. Well nourished.  Vascular Foot warm and well perfused. Capillary refill normal to all digits.   Neurologic Normal speech. Oriented to person, place, and time. Epicritic sensation to light touch grossly present bilaterally.  Dermatologic   Stiffness noted across the first MPJ site.  Skin completely reepithelialized  Pain with range of motion left metatarsophalangeal joint.  Pain on palpation to the first MPJ.  Deep intra-articular pain noted.  No pain at the second metatarsophalangeal joint.  No neuroma noted.  Orthopedic:  No tenderness to palpation noted about the surgical site.   Radiographs: 3 views of skeletally mature the right foot: Hardware is intact.  No signs of loosening or backing out noted.  Fusion noted across the fusion site. Assessment:   1. Capsulitis of metatarsophalangeal (MTP) joint of left foot     Plan:  Patient was evaluated and treated and all questions answered.  Left first metatarsophalangeal joint arthritis/capsulitis -I explained the patient the etiology of arthritis and various treatment options were discussed.  Given that this is started to flareup I believe patient will benefit from a steroid injection.  I discussed with her that given that she is doing well on the right side with MPJ fusion we can  discuss doing the left side as well if there is no improvement with steroid injection.  She states understanding. -A s second steroid injection was performed at left first MPJ using 1% plain Lidocaine and 10 mg of Kenalog. This was well tolerated.   S/p foot surgery right Has completely epithelialized no acute complaints  Right hallux onychomycosis -Clinically improving   No follow-ups on file.

## 2021-03-06 ENCOUNTER — Other Ambulatory Visit: Payer: Self-pay

## 2021-03-06 ENCOUNTER — Encounter: Payer: Self-pay | Admitting: Podiatry

## 2021-03-06 ENCOUNTER — Ambulatory Visit: Payer: BC Managed Care – PPO | Admitting: Podiatry

## 2021-03-06 DIAGNOSIS — M7752 Other enthesopathy of left foot: Secondary | ICD-10-CM

## 2021-03-06 NOTE — Progress Notes (Signed)
Subjective:  Patient ID: Nicole Romero, female    DOB: [birth year],  MRN: [id number]  Chief Complaint  Patient presents with   Injections    Injection      60 y.o. 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  She states that she started get a mild flareup.  She states that she flared up when she went out of the country.  She is doing a lot better now.  She states that she still has a lot of pain.  She denies any other acute complaints  Review of Systems: Negative except as noted in the HPI. Denies N/V/F/Ch.  No past medical history on file.  Current Outpatient Medications:    Calcium Carbonate-Vitamin D 600-400 MG-UNIT tablet, Take by mouth., Disp: , Rfl:    Cholecalciferol (VITAMIN D3) 125 MCG (5000 UT) TABS, Take by mouth., Disp: , Rfl:    ibuprofen (ADVIL) 800 MG tablet, TAKE 1 TABLET(800 MG) BY MOUTH EVERY 6 HOURS AS NEEDED, Disp: 60 tablet, Rfl: 1   Ivermectin 1 % CREA, Apply topically., Disp: , Rfl:    magnesium oxide (MAG-OX) 400 MG tablet, Take by mouth., Disp: , Rfl:    meloxicam (MOBIC) 15 MG tablet, TAKE 1 TABLET(15 MG) BY MOUTH DAILY, Disp: 60 tablet, Rfl: 1   omeprazole (PRILOSEC) 20 MG capsule, Take by mouth., Disp: , Rfl:    omeprazole (PRILOSEC) 40 MG capsule, Take 40 mg by mouth daily., Disp: , Rfl:    oxyCODONE-acetaminophen (PERCOCET) 10-325 MG tablet, Take 1 tablet by mouth every 4 (four) hours as needed for pain., Disp: 30 tablet, Rfl: 0   pramipexole (MIRAPEX) 0.5 MG tablet, Take by mouth., Disp: , Rfl:    tamoxifen (NOLVADEX) 20 MG tablet, TAKE 1 TABLET BY MOUTH EVERY DAY, Disp: , Rfl:   Social History   Tobacco Use  Smoking Status Never  Smokeless Tobacco Never    Allergies  Allergen Reactions   Other Itching   Objective:   There were no vitals filed for this visit. There is no height or weight on file to calculate BMI. Constitutional Well developed. Well nourished.  Vascular Foot warm and  well perfused. Capillary refill normal to all digits.   Neurologic Normal speech. Oriented to person, place, and time. Epicritic sensation to light touch grossly present bilaterally.  Dermatologic   Stiffness noted across the first MPJ site.  Skin completely reepithelialized  Pain with range of motion left metatarsophalangeal joint.  Pain on palpation to the first MPJ.  Deep intra-articular pain noted.  No pain at the second metatarsophalangeal joint.  No neuroma noted.  Orthopedic:  No tenderness to palpation noted about the surgical site.   Radiographs: 3 views of skeletally mature the right foot: Hardware is intact.  No signs of loosening or backing out noted.  Fusion noted across the fusion site. Assessment:   1. Capsulitis of metatarsophalangeal (MTP) joint of left foot      Plan:  Patient was evaluated and treated and all questions answered.  Left first metatarsophalangeal joint arthritis/capsulitis -I explained the patient the etiology of arthritis and various treatment options were discussed.  Given that this is started to flareup I believe patient will benefit from a steroid injection.  I discussed with her that given that she is doing well on the right side with MPJ fusion we can discuss doing the left side as well if there is no improvement with steroid injection.  She states understanding. -A third  steroid injection was performed at left first MPJ using 1% plain Lidocaine and 10 mg of Kenalog. This was well tolerated.   S/p foot surgery right Has completely epithelialized no acute complaints  Right hallux onychomycosis -Clinically improving   No follow-ups on file.

## 2021-08-07 ENCOUNTER — Ambulatory Visit: Payer: BC Managed Care – PPO | Admitting: Podiatry

## 2021-08-07 ENCOUNTER — Other Ambulatory Visit: Payer: Self-pay

## 2021-08-07 DIAGNOSIS — M7752 Other enthesopathy of left foot: Secondary | ICD-10-CM | POA: Diagnosis not present

## 2021-08-07 NOTE — Progress Notes (Signed)
?Subjective:  ?Patient ID: Nicole Romero, female    DOB: Aug 30, 1960,  MRN: [id number] ? ?Chief Complaint  ?Patient presents with  ? Injections  ? ? ? ?61 y.o. 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  She states that she started get a mild flareup.  She states that she flared up when she went out of the country.  She is doing a lot better now.  She states that she still has a lot of pain.  She denies any other acute complaints ? ?Review of Systems: Negative except as noted in the HPI. Denies N/V/F/Ch. ? ?No past medical history on file. ? ?Current Outpatient Medications:  ?  Calcium Carbonate-Vitamin D 600-400 MG-UNIT tablet, Take by mouth., Disp: , Rfl:  ?  Cholecalciferol (VITAMIN D3) 125 MCG (5000 UT) TABS, Take by mouth., Disp: , Rfl:  ?  ibuprofen (ADVIL) 800 MG tablet, TAKE 1 TABLET(800 MG) BY MOUTH EVERY 6 HOURS AS NEEDED, Disp: 60 tablet, Rfl: 1 ?  Ivermectin 1 % CREA, Apply topically., Disp: , Rfl:  ?  magnesium oxide (MAG-OX) 400 MG tablet, Take by mouth., Disp: , Rfl:  ?  meloxicam (MOBIC) 15 MG tablet, TAKE 1 TABLET(15 MG) BY MOUTH DAILY, Disp: 60 tablet, Rfl: 1 ?  omeprazole (PRILOSEC) 20 MG capsule, Take by mouth., Disp: , Rfl:  ?  omeprazole (PRILOSEC) 40 MG capsule, Take 40 mg by mouth daily., Disp: , Rfl:  ?  oxyCODONE-acetaminophen (PERCOCET) 10-325 MG tablet, Take 1 tablet by mouth every 4 (four) hours as needed for pain., Disp: 30 tablet, Rfl: 0 ?  pramipexole (MIRAPEX) 0.5 MG tablet, Take by mouth., Disp: , Rfl:  ?  tamoxifen (NOLVADEX) 20 MG tablet, TAKE 1 TABLET BY MOUTH EVERY DAY, Disp: , Rfl:  ? ?Social History  ? ?Tobacco Use  ?Smoking Status Never  ?Smokeless Tobacco Never  ? ? ?Allergies  ?Allergen Reactions  ? Other Itching  ? ?Objective:  ? ?There were no vitals filed for this visit. ?There is no height or weight on file to calculate BMI. ?Constitutional Well developed. ?Well nourished.  ?Vascular Foot warm and well  perfused. ?Capillary refill normal to all digits.   ?Neurologic Normal speech. ?Oriented to person, place, and time. ?Epicritic sensation to light touch grossly present bilaterally.  ?Dermatologic   Stiffness noted across the first MPJ site.  Skin completely reepithelialized ? ?Pain with range of motion left metatarsophalangeal joint.  Pain on palpation to the first MPJ.  Deep intra-articular pain noted.  No pain at the second metatarsophalangeal joint.  No neuroma noted.  ?Orthopedic:  No tenderness to palpation noted about the surgical site.  ? ?Radiographs: 3 views of skeletally mature the right foot: Hardware is intact.  No signs of loosening or backing out noted.  Fusion noted across the fusion site. ?Assessment:  ? ?1. Capsulitis of metatarsophalangeal (MTP) joint of left foot   ? ? ? ?Plan:  ?Patient was evaluated and treated and all questions answered. ? ?Left first metatarsophalangeal joint arthritis/capsulitis ?-I explained the patient the etiology of arthritis and various treatment options were discussed.  Given that this is started to flareup I believe patient will benefit from a steroid injection.  I discussed with her that given that she is doing well on the right side with MPJ fusion we can discuss doing the left side as well if there is no improvement with steroid injection.  She states understanding. ?-Another steroid injection was performed at left  first MPJ using 1% plain Lidocaine and 10 mg of Kenalog. This was well tolerated. ? ? ?S/p foot surgery right ?Has completely epithelialized no acute complaints ? ?Right hallux onychomycosis ?-Clinically improving ? ? ?No follow-ups on file.  ?

## 2021-12-12 ENCOUNTER — Ambulatory Visit: Payer: BC Managed Care – PPO | Admitting: Podiatry

## 2021-12-12 DIAGNOSIS — M7752 Other enthesopathy of left foot: Secondary | ICD-10-CM | POA: Diagnosis not present

## 2021-12-16 ENCOUNTER — Encounter: Payer: Self-pay | Admitting: Podiatry

## 2021-12-16 NOTE — Progress Notes (Signed)
Subjective:  Patient ID: Nicole Romero, female    DOB: 1961-03-27,  MRN: [id number]  Chief Complaint  Patient presents with   Foot Pain    left foot pain, needs injection, Pain located in midfoot near the great toe, Pain level varies in intensity     61 y.o. 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  She states that she started get a mild flareup.  She states the injection definitely helps.  She would like to do another injection.  She is eventually going to need a surgical fusion of the left side.  Review of Systems: Negative except as noted in the HPI. Denies N/V/F/Ch.  No past medical history on file.  Current Outpatient Medications:    Calcium Carbonate-Vitamin D 600-400 MG-UNIT tablet, Take by mouth., Disp: , Rfl:    Cholecalciferol (VITAMIN D3) 125 MCG (5000 UT) TABS, Take by mouth., Disp: , Rfl:    ibuprofen (ADVIL) 800 MG tablet, TAKE 1 TABLET(800 MG) BY MOUTH EVERY 6 HOURS AS NEEDED, Disp: 60 tablet, Rfl: 1   Ivermectin 1 % CREA, Apply topically., Disp: , Rfl:    magnesium oxide (MAG-OX) 400 MG tablet, Take by mouth., Disp: , Rfl:    meloxicam (MOBIC) 15 MG tablet, TAKE 1 TABLET(15 MG) BY MOUTH DAILY, Disp: 60 tablet, Rfl: 1   omeprazole (PRILOSEC) 20 MG capsule, Take by mouth., Disp: , Rfl:    omeprazole (PRILOSEC) 40 MG capsule, Take 40 mg by mouth daily., Disp: , Rfl:    oxyCODONE-acetaminophen (PERCOCET) 10-325 MG tablet, Take 1 tablet by mouth every 4 (four) hours as needed for pain., Disp: 30 tablet, Rfl: 0   pramipexole (MIRAPEX) 0.5 MG tablet, Take by mouth., Disp: , Rfl:    RYBELSUS 3 MG TABS, Take 1 tablet by mouth daily., Disp: , Rfl:    tamoxifen (NOLVADEX) 20 MG tablet, TAKE 1 TABLET BY MOUTH EVERY DAY, Disp: , Rfl:   Social History   Tobacco Use  Smoking Status Never  Smokeless Tobacco Never    Allergies  Allergen Reactions   Other Itching   Objective:   There were no vitals filed for this  visit. There is no height or weight on file to calculate BMI. Constitutional Well developed. Well nourished.  Vascular Foot warm and well perfused. Capillary refill normal to all digits.   Neurologic Normal speech. Oriented to person, place, and time. Epicritic sensation to light touch grossly present bilaterally.  Dermatologic   Stiffness noted across the first MPJ site.  Skin completely reepithelialized  Pain with range of motion left metatarsophalangeal joint.  Pain on palpation to the first MPJ.  Deep intra-articular pain noted.  No pain at the second metatarsophalangeal joint.  No neuroma noted.  Orthopedic:  No tenderness to palpation noted about the surgical site.   Radiographs: 3 views of skeletally mature the right foot: Hardware is intact.  No signs of loosening or backing out noted.  Fusion noted across the fusion site. Assessment:   1. Capsulitis of metatarsophalangeal (MTP) joint of left foot       Plan:  Patient was evaluated and treated and all questions answered.  Left first metatarsophalangeal joint arthritis/capsulitis -I explained the patient the etiology of arthritis and various treatment options were discussed.  Given that this is started to flareup I believe patient will benefit from a steroid injection.  I discussed with her that given that she is doing well on the right side with MPJ fusion  we can discuss doing the left side as well if there is no improvement with steroid injection.  She states understanding. -Another steroid injection was performed at left first MPJ using 1% plain Lidocaine and 10 mg of Kenalog. This was well tolerated.   S/p foot surgery right Has completely epithelialized no acute complaints  Right hallux onychomycosis -Clinically improving   No follow-ups on file.
